# Patient Record
Sex: Male | Born: 1985 | Race: White | Hispanic: No | Marital: Single | Smoking: Never smoker
Health system: Southern US, Community
[De-identification: ages and names within clinical notes are randomized; demographics above are authoritative.]

## PROBLEM LIST (undated history)

## (undated) DIAGNOSIS — R569 Unspecified convulsions: Secondary | ICD-10-CM

## (undated) HISTORY — DX: Unspecified convulsions: R56.9

## (undated) HISTORY — PX: FINGER SURGERY: SHX640

## (undated) HISTORY — PX: VENTRICULOPERITONEAL SHUNT: SHX204

---
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
## 2012-09-05 ENCOUNTER — Other Ambulatory Visit: Payer: Self-pay

## 2012-09-05 MED ORDER — TEGRETOL 200 MG PO TABS: ORAL_TABLET | ORAL | Status: DC

## 2012-12-10 ENCOUNTER — Encounter: Payer: Self-pay | Admitting: Family

## 2012-12-10 ENCOUNTER — Ambulatory Visit (INDEPENDENT_AMBULATORY_CARE_PROVIDER_SITE_OTHER): Payer: Medicaid Other | Admitting: Family

## 2012-12-10 VITALS — BP 120/74 | HR 80 | Ht 68.5 in | Wt 142.8 lb

## 2012-12-10 DIAGNOSIS — Z79899 Other long term (current) drug therapy: Secondary | ICD-10-CM

## 2012-12-10 DIAGNOSIS — R269 Unspecified abnormalities of gait and mobility: Secondary | ICD-10-CM

## 2012-12-10 DIAGNOSIS — G808 Other cerebral palsy: Secondary | ICD-10-CM

## 2012-12-10 DIAGNOSIS — Q043 Other reduction deformities of brain: Secondary | ICD-10-CM

## 2012-12-10 DIAGNOSIS — G40209 Localization-related (focal) (partial) symptomatic epilepsy and epileptic syndromes with complex partial seizures, not intractable, without status epilepticus: Secondary | ICD-10-CM

## 2012-12-10 NOTE — Progress Notes (Signed)
Patient: Dennis Bautista MRN: [id number] Sex: male DOB: 01/12/1986  Provider: Elveria Rising, NP Location of Care: [facility]  Note type: Routine return visit  History of Present Illness: Referral Source: Dr. John Giovanni History from: Mother Chief Complaint: Yearly F/U Epilepsy  Dennis Bautista is a 27 y.o. 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 He has a well controlled seizure disorder. His last imaging was in April 2003.  He has been seizure-free since March 1985.  Dennis Bautista has been healthy since he was last seen in September, 2013. 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 He rides a motorcycle with his father and enjoys traveling to various places. He is preparing to go the beach for a month with friends of the family. Dennis Bautista is also going on a cruise in December.    Review of Systems: 12 system review was remarkable for birthmark.  Past Medical History  Diagnosis Date  . 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  He also has history of  seizures.  Surgical History Past Surgical History  Procedure Laterality Date  . Ventriculoperitoneal shunt  1986    has also had revisions of the shunt    Family History family history includes Breast cancer in his paternal grandmother; Diabetes in his other; Heart disease in his other; Stomach cancer in his maternal grandmother. Family History is negative migraines, seizures, cognitive impairment, blindness, deafness, birth defects, chromosomal disorder, autism.  Social History History   Social History  . Marital Status: Single    Spouse Name: N/A    Number of Children: N/A  . Years of Education: N/A   Social History Main Topics  . Smoking status: Never Smoker   . Smokeless tobacco: Never Used  . Alcohol Use: No  . Drug Use: No  . Sexual Activity: Not on file   Other Topics Concern  . Not on file   Social History Narrative  . 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  Living with both parents  Hobbies/Interest: Riding motorcycles with his father and going to drag races.  Current Outpatient Prescriptions on File Prior to Visit  Medication Sig Dispense Refill  . TEGRETOL 200 MG tablet Take 1 tab by mouth every morning, 1 tab at 3 pm and 1.5 tabs at 8 pm  110 tablet  3   No current facility-administered medications on file prior to visit.   The medication list was reviewed and reconciled. All changes or newly prescribed medications were explained.  A complete medication list was provided to the patient/caregiver.  No Known  Allergies  Physical Exam BP 120/74  Pulse 80  Ht 5' 8.5" (1.74 m)  Wt 142 lb 12.8 oz (64.774 kg)  BMI 21.39 kg/m2 General: well developed, well nourished young man, seated on exam table,  in no evident distress Head: head  normocephalic and atraumatic.   Ears, Nose and Throat:  Oropharynx benign. Neck: supple with no carotid or supraclavicular bruits. Respiratory: lungs clear to  auscultation Cardiovascular: regular rate and rhythm, no murmurs Musculoskeletal: He has a deformity of the right 5th finger, which is flexed into his palm.  Skin: no rashes  Neurologic Exam  Mental Status: Awake and fully alert.  Oriented to place and time.  Attention span and concentration appropriate.  Mood and affect appropriate. He needed help from his mother to answer some questions.  Cranial Nerves: Fundoscopic exam reveals sharp disc margins.  Pupils equal, briskly reactive to light.  Extraocular movements full without nystagmus.  Visual fields full to confrontation.  Hearing intact and symmetric to finger rub.  Facial sensation intact.  Facial movements are normal on the right. He has mild facial weakness on the left. Tongue and palate move normally and symmetrically.  Neck flexion and extension normal. Motor: Normal bulk and tone except for mild left hemiatrophy.  Normal strength in all tested extremity muscles except for 4/5 strength on the left upper and lower extremity. He has difficulty with thumb and finger opposition on the left.  Sensory: Intact to touch and temperature in all extremities. Coordination: Rapid alternating movements slightly slowed on the left.  Romberg negative. Gait and Station: Arises from chair without difficulty.  Stance is slightly broad based. Gait demonstrates left hemiparetic gait but fairly good balance.  Able to rise to his toes but has difficulty walking without ataxia.  Reflexes: 1+ and symmetric on the right and diminished on the right.  Toes downgoing.   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  He has schizencephaly of the right parietal region, a Chiari I malformation that seems to be  receding over time, and a functioning ventriculoperitoneal shunt. He has a well controlled seizure disorder. Dennis Bautista is taking and tolerating Tegretol for his seizures. We will make no changes in his treatment plan at this time. He will return for follow up in 1 year or sooner if needed.

## 2012-12-12 ENCOUNTER — Encounter: Payer: Self-pay | Admitting: Family

## 2012-12-12 DIAGNOSIS — Q043 Other reduction deformities of brain: Secondary | ICD-10-CM | POA: Insufficient documentation

## 2012-12-12 DIAGNOSIS — R269 Unspecified abnormalities of gait and mobility: Secondary | ICD-10-CM | POA: Insufficient documentation

## 2012-12-12 DIAGNOSIS — G40209 Localization-related (focal) (partial) symptomatic epilepsy and epileptic syndromes with complex partial seizures, not intractable, without status epilepticus: Secondary | ICD-10-CM | POA: Insufficient documentation

## 2012-12-12 DIAGNOSIS — G808 Other cerebral palsy: Secondary | ICD-10-CM | POA: Insufficient documentation

## 2012-12-12 DIAGNOSIS — Z79899 Other long term (current) drug therapy: Secondary | ICD-10-CM | POA: Insufficient documentation

## 2012-12-12 DIAGNOSIS — Z0000 Encounter for general adult medical examination without abnormal findings: Secondary | ICD-10-CM | POA: Insufficient documentation

## 2012-12-12 NOTE — Patient Instructions (Signed)
Continue Tegretol 200mg  without change. Call me if [name] has any seizures.  Plan to return for follow up in 1 year or sooner if needed.

## 2013-01-12 ENCOUNTER — Other Ambulatory Visit: Payer: Self-pay | Admitting: Family

## 2013-07-18 ENCOUNTER — Emergency Department (HOSPITAL_COMMUNITY)
Admission: EM | Admit: 2013-07-18 | Discharge: 2013-07-18 | Disposition: A | Discharge status: Home / Self Care | Payer: Medicaid Other | Attending: Emergency Medicine | Admitting: Emergency Medicine

## 2013-07-18 ENCOUNTER — Encounter (HOSPITAL_COMMUNITY): Payer: Self-pay | Admitting: Emergency Medicine

## 2013-07-18 ENCOUNTER — Emergency Department (HOSPITAL_COMMUNITY): Payer: Medicaid Other

## 2013-07-18 DIAGNOSIS — S0990XA Unspecified injury of head, initial encounter: Secondary | ICD-10-CM | POA: Insufficient documentation

## 2013-07-18 DIAGNOSIS — Z982 Presence of cerebrospinal fluid drainage device: Secondary | ICD-10-CM | POA: Insufficient documentation

## 2013-07-18 DIAGNOSIS — Y9389 Activity, other specified: Secondary | ICD-10-CM | POA: Insufficient documentation

## 2013-07-18 DIAGNOSIS — Z79899 Other long term (current) drug therapy: Secondary | ICD-10-CM | POA: Insufficient documentation

## 2013-07-18 DIAGNOSIS — Y92410 Unspecified street and highway as the place of occurrence of the external cause: Secondary | ICD-10-CM | POA: Insufficient documentation

## 2013-07-18 DIAGNOSIS — S199XXA Unspecified injury of neck, initial encounter: Principal | ICD-10-CM

## 2013-07-18 DIAGNOSIS — G40909 Epilepsy, unspecified, not intractable, without status epilepticus: Secondary | ICD-10-CM | POA: Insufficient documentation

## 2013-07-18 DIAGNOSIS — S0993XA Unspecified injury of face, initial encounter: Secondary | ICD-10-CM | POA: Insufficient documentation

## 2013-07-18 DIAGNOSIS — M542 Cervicalgia: Secondary | ICD-10-CM

## 2013-07-18 MED ORDER — CYCLOBENZAPRINE HCL 10 MG PO TABS: 10.0000 mg | ORAL_TABLET | Freq: Two times a day (BID) | ORAL | Status: DC | PRN
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
## 2013-07-18 NOTE — Care Management Note (Signed)
Patient was noted to not have a PCP listed, but per patient PCP is Dr Sudie BaileyKnowlton. Entered this information into computer.

## 2013-07-18 NOTE — Discharge Instructions (Signed)
X-ray of cervical spine was normal. Shunt was visualized and  appears normal. Medication for muscle spasm. Tylenol or ibuprofen for pain
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
## 2013-07-22 ENCOUNTER — Other Ambulatory Visit: Payer: Self-pay | Admitting: Family

## 2013-10-14 ENCOUNTER — Ambulatory Visit (INDEPENDENT_AMBULATORY_CARE_PROVIDER_SITE_OTHER): Payer: Medicaid Other | Admitting: Family

## 2013-10-14 ENCOUNTER — Encounter: Payer: Self-pay | Admitting: Family

## 2013-10-14 VITALS — BP 116/72 | HR 84 | Ht 67.0 in | Wt 146.0 lb

## 2013-10-14 DIAGNOSIS — Q043 Other reduction deformities of brain: Secondary | ICD-10-CM

## 2013-10-14 DIAGNOSIS — G808 Other cerebral palsy: Secondary | ICD-10-CM

## 2013-10-14 DIAGNOSIS — R269 Unspecified abnormalities of gait and mobility: Secondary | ICD-10-CM

## 2013-10-14 DIAGNOSIS — Z79899 Other long term (current) drug therapy: Secondary | ICD-10-CM

## 2013-10-14 DIAGNOSIS — G40209 Localization-related (focal) (partial) symptomatic epilepsy and epileptic syndromes with complex partial seizures, not intractable, without status epilepticus: Secondary | ICD-10-CM
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
## 2013-10-15 ENCOUNTER — Encounter: Payer: Self-pay | Admitting: Family
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
## 2013-12-10 ENCOUNTER — Other Ambulatory Visit: Payer: Self-pay | Admitting: Family

## 2013-12-10 MED ORDER — TEGRETOL 200 MG PO TABS: ORAL_TABLET | ORAL | Status: DC

## 2014-08-12 ENCOUNTER — Telehealth: Payer: Self-pay

## 2014-08-12 MED ORDER — TEGRETOL 200 MG PO TABS: ORAL_TABLET | ORAL | Status: DC

## 2014-08-12 NOTE — Telephone Encounter (Signed)
Rx faxed. TG 

## 2014-08-12 NOTE — Telephone Encounter (Signed)
Mardelle MatteAndy from, [facility], lvm asking for refill to be sent to them with "Brand Name Medically Necessary" written on it.

## 2014-09-02 DIAGNOSIS — Z0279 Encounter for issue of other medical certificate: Secondary | ICD-10-CM

## 2014-10-15 ENCOUNTER — Encounter: Payer: Self-pay | Admitting: Family

## 2014-10-15 ENCOUNTER — Ambulatory Visit (INDEPENDENT_AMBULATORY_CARE_PROVIDER_SITE_OTHER): Payer: Medicaid Other | Admitting: Family

## 2014-10-15 VITALS — BP 118/74 | HR 80 | Ht 68.0 in | Wt 161.4 lb

## 2014-10-15 DIAGNOSIS — Q043 Other reduction deformities of brain: Secondary | ICD-10-CM

## 2014-10-15 DIAGNOSIS — G40209 Localization-related (focal) (partial) symptomatic epilepsy and epileptic syndromes with complex partial seizures, not intractable, without status epilepticus: Secondary | ICD-10-CM | POA: Diagnosis not present

## 2014-10-15 DIAGNOSIS — R269 Unspecified abnormalities of gait and mobility: Secondary | ICD-10-CM | POA: Diagnosis not present

## 2014-10-15 DIAGNOSIS — G808 Other cerebral palsy: Secondary | ICD-10-CM | POA: Diagnosis not present

## 2014-10-15 MED ORDER — TEGRETOL 200 MG PO TABS: ORAL_TABLET | ORAL | Status: DC

## 2014-10-15 NOTE — Patient Instructions (Signed)
Dennis Bautista's medication as he has been taking it. Let me know if he has any seizures.    [name] is doing well with maintaining his weight. He should Dennis his yoga and exercise at the [facility].   Please plan to return for follow up in 1 year or sooner if needed.
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
## 2015-06-03 ENCOUNTER — Other Ambulatory Visit: Payer: Self-pay | Admitting: Family

## 2015-11-30 ENCOUNTER — Other Ambulatory Visit: Payer: Self-pay | Admitting: Family

## 2016-01-13 ENCOUNTER — Encounter (INDEPENDENT_AMBULATORY_CARE_PROVIDER_SITE_OTHER): Payer: Self-pay | Admitting: Family

## 2016-01-14 ENCOUNTER — Ambulatory Visit (INDEPENDENT_AMBULATORY_CARE_PROVIDER_SITE_OTHER): Payer: Medicaid Other | Admitting: Family

## 2016-01-14 ENCOUNTER — Encounter (INDEPENDENT_AMBULATORY_CARE_PROVIDER_SITE_OTHER): Payer: Self-pay | Admitting: Family

## 2016-01-14 VITALS — BP 120/70 | HR 84 | Ht 67.5 in | Wt 182.0 lb

## 2016-01-14 DIAGNOSIS — Q043 Other reduction deformities of brain: Secondary | ICD-10-CM

## 2016-01-14 DIAGNOSIS — R269 Unspecified abnormalities of gait and mobility: Secondary | ICD-10-CM

## 2016-01-14 DIAGNOSIS — G808 Other cerebral palsy: Secondary | ICD-10-CM | POA: Diagnosis not present

## 2016-01-14 DIAGNOSIS — G40209 Localization-related (focal) (partial) symptomatic epilepsy and epileptic syndromes with complex partial seizures, not intractable, without status epilepticus: Secondary | ICD-10-CM | POA: Diagnosis not present
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
## 2016-01-14 NOTE — Patient Instructions (Signed)
Continue giving Dennis Bautista's medication as you have been giving it. Let me know if he has any seizures or if you have any concerns.   Please plan to return for follow up in 1 year or sooner if needed.

## 2016-01-27 ENCOUNTER — Other Ambulatory Visit (INDEPENDENT_AMBULATORY_CARE_PROVIDER_SITE_OTHER): Payer: Self-pay | Admitting: Family

## 2016-08-18 ENCOUNTER — Other Ambulatory Visit: Payer: Self-pay | Admitting: Pediatrics

## 2016-09-08 ENCOUNTER — Other Ambulatory Visit: Payer: Self-pay | Admitting: Pediatrics

## 2016-09-08 MED ORDER — TEGRETOL 200 MG PO TABS: ORAL_TABLET | ORAL | 0 refills | Status: DC

## 2016-09-08 NOTE — Addendum Note (Signed)
Addended by: Harland DingwallMITCHELL, Kaeden Mester A on: 09/08/2016 11:26 AM   Modules accepted: Orders

## 2016-09-15 ENCOUNTER — Telehealth (INDEPENDENT_AMBULATORY_CARE_PROVIDER_SITE_OTHER): Payer: Self-pay | Admitting: Family

## 2016-09-15 MED ORDER — TEGRETOL 200 MG PO TABS: ORAL_TABLET | ORAL | 0 refills | Status: DC

## 2016-09-15 NOTE — Telephone Encounter (Signed)
Patient needs to sign DPR

## 2016-09-15 NOTE — Telephone Encounter (Signed)
Medication has been filled and faxed to the pharmacy

## 2016-09-15 NOTE — Telephone Encounter (Signed)
°  Who's calling (name and relationship to patient) : Karen KitchensBobbie (mom) Best contact number: [phone] Provider they see: [facility] Reason for call: Mom call for refill of Rx. Scheduled appt in September.    PRESCRIPTION REFILL ONLY  Name of prescription: Tegretol 200mg   Pharmacy:

## 2016-11-15 ENCOUNTER — Ambulatory Visit (INDEPENDENT_AMBULATORY_CARE_PROVIDER_SITE_OTHER): Payer: Medicaid Other | Admitting: Family

## 2016-11-15 ENCOUNTER — Encounter (INDEPENDENT_AMBULATORY_CARE_PROVIDER_SITE_OTHER): Payer: Self-pay | Admitting: Family

## 2016-11-15 VITALS — BP 116/70 | HR 80 | Ht 67.5 in | Wt 183.0 lb

## 2016-11-15 DIAGNOSIS — G808 Other cerebral palsy: Secondary | ICD-10-CM

## 2016-11-15 DIAGNOSIS — R269 Unspecified abnormalities of gait and mobility: Secondary | ICD-10-CM

## 2016-11-15 DIAGNOSIS — G40209 Localization-related (focal) (partial) symptomatic epilepsy and epileptic syndromes with complex partial seizures, not intractable, without status epilepticus: Secondary | ICD-10-CM | POA: Diagnosis not present

## 2016-11-15 DIAGNOSIS — F79 Unspecified intellectual disabilities: Secondary | ICD-10-CM

## 2016-11-15 DIAGNOSIS — Q043 Other reduction deformities of brain: Secondary | ICD-10-CM

## 2016-11-15 MED ORDER — TEGRETOL 200 MG PO TABS: ORAL_TABLET | ORAL | 5 refills | Status: DC

## 2016-11-15 NOTE — Progress Notes (Signed)
Patient: Dennis Bautista MRN: [id number] Sex: male DOB: 12-22-1985  Provider: Elveria Rising, NP Location of Care: [facility]  Note type: Routine return visit  History of Present Illness: Referral Source: Dennis Look, MD History from: mother, patient and [facility] chart Chief Complaint: Epilepsy  Dennis Bautista is a 31 y.o. 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 He has schizencephaly of the right parietal region, a Chiari 1 malformation, that seems to be receding over time, and a functioning ventriculoperitoneal shunt. His last imaging was in 2003. He has mild intellectual disability, complex partial seizures with secondary generalization in good control, and left congential hemiplegiaTyler was last seen January 14, 2016.   Dennis Bautista is taking and tolerating brand Tegretol for his seizure disorder and has remained seizure free since March 1985. Dennis Bautista exercises every day at the [facility] doing yoga and other group exercises. 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 There are no problems with behavior. He has been generally healthy since his last visit and neither Dennis Bautista nor his mother have other health concerns for him today other than previously mentioned.   Review of Systems: Please see the HPI for neurologic and other pertinent review of systems. Otherwise, all other systems were reviewed and were negative.    Past Medical History:  Diagnosis Date  . Seizures (HCC)    Hospitalizations: No., Head Injury: No., Nervous System Infections: No., Immunizations up to date: Yes.   Past Medical History Comments: See history   Surgical History Past Surgical History:  Procedure Laterality Date  . FINGER SURGERY      . VENTRICULOPERITONEAL SHUNT  1986   has also had revisions of the shunt    Family History family history includes Breast cancer in his paternal grandmother; Diabetes in his other; Emphysema in his maternal grandfather; Heart attack in his paternal grandfather; Heart disease in his other; Lung cancer in his paternal grandmother; Stomach cancer in his maternal grandmother. Family History is otherwise negative for migraines, seizures, cognitive impairment, blindness, deafness, birth defects, chromosomal disorder, autism.  Social History Social History   Social History  . Marital status: Single    Spouse name: N/A  . Number of children: N/A  . Years of education: N/A   Social History Main Topics  . Smoking status: Never Smoker  . Smokeless tobacco: Never Used  . Alcohol use No  . Drug use: No  . Sexual activity: No   Other Topics Concern  . 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    Lives with his parents.     Allergies Allergies  Allergen Reactions  . Other     Seasonal Allergies    Physical Exam BP 116/70   Pulse 80   Ht 5' 7.5" (1.715 m)   Wt 183 lb (83 kg)   BMI 28.24 kg/m  General: well developed, well nourished young man, seated on exam table, in no evident distress, brown hair, brown eyes, right handed Head: head normocephalic and atraumatic.  Oropharynx benign. Neck: supple with no carotid or supraclavicular bruits Cardiovascular: regular rate and rhythm, no murmurs Respiratory: breath sounds clear  to ausculation Musculoskeletal: he has a deformity to the right 5th finger, which is flexed into his palm. Skin: No rashes or neurocutaneous lesions  Neurologic Exam Mental Status: Awake and fully alert.  Oriented to place and time.  Recent and remote memory intact.  Attention span appropriate.  Mood and affect appropriate. He was unable to answer questions on his own  and needed help from his mother. He was cooperative and able to follow simple instructions. He needed extra time and some demonstrations to perform the examination.  Cranial Nerves: Fundoscopic exam reveals sharp disc margins.  Pupils equal, briskly reactive to light.  Extraocular movements full without nystagmus.  Visual fields full to confrontation.  Hearing intact and symmetric to finger rub.  Facial sensation intact. Facial movements are normal on the right. He has mild facial weakness on the left with mild drooling. Neck flexion and extension normal. Motor: Normal bulk, tone and strength on the right. He has mild left hemiatrophy. He has 4/5 left upper and lower extremity weakness. He has difficulty with left thumb and finger opposition on the left.  Sensory: Intact to touch and temperature in all extremities.  Coordination: Rapid alternating movements normal on the right, slowed on the left.  Romberg negative. Gait and Station: Arises from chair without difficulty.  Stance is normal. Gait is slightly broad based and is slightly hemiparetic on the left. He has fairly good balance. He is able to get up on his toes but has difficulty walking without some ataxia.  Reflexes: 1+ and symmetric on the right, diminished on the left. Toes downgoing.  Impression 1. Congenital left hemiplegia 2. Congenital brain malformations 3. 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 Gait abnormality 6. Intellectual disability   Recommendations for plan of care The patient's previous [facility] records were reviewed. Dennis Bautista has neither had nor required imaging or lab studies since the last visit. He is a 31 year old man with complex partial seizures with secondary generalization in good control, congenital brain malformations, congenital left hemiplegia, VP shunt, and mild intellectual disability. He is taking and tolerating brand Tegretol for his seizure disorder and will remain  on his medication for the foreseeable future because of the risk of seizure occurrence. Dennis Bautista is active at home and in the community. I commended him for exercising every day and encouraged him to continue to do so. I will see him back in follow up in 1 year or sooner if needed. Dennis Bautista and his mother agreed with the plans made today.   The medication list was reviewed and reconciled.  No changes were made in the prescribed medications today.  A complete medication list was provided to the patient and his mother.  Allergies as of 11/15/2016      Reactions   Other    Seasonal Allergies      Medication List       Accurate as of 11/15/16 11:59 PM. Always use your most recent med list.          fluticasone 50 MCG/ACT nasal spray Commonly known as:  FLONASE Place 2 sprays into both nostrils daily.   TEGRETOL 200 MG tablet Generic drug:  carbamazepine TAKE 1 TABLET IN THE MORNING AND 1 TABLET AT 3PM AND 1 AND 1/2 TABLETS AT8PM            Discharge Care Instructions        Start     Ordered   11/15/16 0000  TEGRETOL 200 MG tablet  Comments:  Brand Name Medically Necessary  Question:  Supervising Provider  Answer:  Dennis Bautista   11/15/16 1515      Total time spent with the patient was 20 minutes, of which 50% or more was spent in counseling and coordination of care.   Dennis Rising NP-C

## 2016-11-16 DIAGNOSIS — F79 Unspecified intellectual disabilities: Secondary | ICD-10-CM | POA: Insufficient documentation

## 2016-11-16 NOTE — Patient Instructions (Signed)
Continue taking the Tegretol as you have been taking it.  Let me know if you have any seizures.  Continue exercising every day as you have been doing.  Please return for follow up in 1 year or sooner if needed.

## 2017-05-29 ENCOUNTER — Other Ambulatory Visit (INDEPENDENT_AMBULATORY_CARE_PROVIDER_SITE_OTHER): Payer: Self-pay | Admitting: Family

## 2017-05-29 DIAGNOSIS — G40209 Localization-related (focal) (partial) symptomatic epilepsy and epileptic syndromes with complex partial seizures, not intractable, without status epilepticus: Secondary | ICD-10-CM

## 2018-01-19 ENCOUNTER — Other Ambulatory Visit (INDEPENDENT_AMBULATORY_CARE_PROVIDER_SITE_OTHER): Payer: Self-pay | Admitting: Family

## 2018-01-19 DIAGNOSIS — G40209 Localization-related (focal) (partial) symptomatic epilepsy and epileptic syndromes with complex partial seizures, not intractable, without status epilepticus: Secondary | ICD-10-CM

## 2018-01-19 MED ORDER — TEGRETOL 200 MG PO TABS: ORAL_TABLET | ORAL | 5 refills | Status: DC

## 2018-01-19 NOTE — Telephone Encounter (Signed)
I printed and signed an Rx for Brand Medically Necessary Tegretol. Please fax to the pharmacy. Thanks, Inetta Fermo

## 2018-01-19 NOTE — Telephone Encounter (Signed)
°  Who's calling (name and relationship to patient) : Karen Kitchens, mother (DPR on file)  Best contact number: [phone]  Provider they see: Elveria Rising  Reason for call: Patient is scheduled for a follow up on 01/25/2018.     PRESCRIPTION REFILL ONLY  Name of prescription: Tegretol  Pharmacy: [facility]

## 2018-01-19 NOTE — Telephone Encounter (Signed)
Rx has been faxed to the pharmacy 

## 2018-01-25 ENCOUNTER — Encounter (INDEPENDENT_AMBULATORY_CARE_PROVIDER_SITE_OTHER): Payer: Self-pay | Admitting: Family

## 2018-01-25 ENCOUNTER — Ambulatory Visit (INDEPENDENT_AMBULATORY_CARE_PROVIDER_SITE_OTHER): Payer: Medicaid Other | Admitting: Family

## 2018-01-25 VITALS — BP 120/90 | HR 60 | Ht 67.5 in | Wt 186.4 lb

## 2018-01-25 DIAGNOSIS — G808 Other cerebral palsy: Secondary | ICD-10-CM

## 2018-01-25 DIAGNOSIS — F79 Unspecified intellectual disabilities: Secondary | ICD-10-CM

## 2018-01-25 DIAGNOSIS — G40209 Localization-related (focal) (partial) symptomatic epilepsy and epileptic syndromes with complex partial seizures, not intractable, without status epilepticus: Secondary | ICD-10-CM | POA: Diagnosis not present

## 2018-01-25 DIAGNOSIS — R269 Unspecified abnormalities of gait and mobility: Secondary | ICD-10-CM

## 2018-01-25 DIAGNOSIS — Q043 Other reduction deformities of brain: Secondary | ICD-10-CM

## 2018-01-25 MED ORDER — TEGRETOL 200 MG PO TABS: ORAL_TABLET | ORAL | 5 refills | Status: DC

## 2018-01-25 NOTE — Patient Instructions (Signed)
Thank you for coming in today.   Instructions for you until your next appointment are as follows: 1. Continue taking Tegretol as you have been doing 2. Try not to miss any doses.  3. Please sign up for MyChart if you have not done so 4. Please plan to return for follow up in one year or sooner if needed.

## 2018-01-25 NOTE — Progress Notes (Signed)
Patient: Dennis Bautista MRN: [id number] Sex: male DOB: [birth year]  Provider: Elveria Rising, NP Location of Care: [facility]  Note type: Routine return visit  History of Present Illness: Referral Source: Katharine Look, MD History from: mother, patient and [facility] chart Chief Complaint: Epilepsy  Dennis Bautista is a 32 y.o. 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 His last imaging was in 2003. 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 Dennis Bautista was last seen November 15, 2016.  He is taking and tolerating brand Tegretol for his seizure disorder and has remained seizure free since March 1985.  Dennis Bautista and his mother report today that he has been doing well since his last visit. 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   Dennis Bautista has been otherwise generally healthy since he was last seen. Neither he nor his mother have other health concerns for him today other than previously mentioned.  Review of Systems: Please see the HPI for neurologic and other pertinent review of systems. Otherwise, all other systems were reviewed and were negative.    Past Medical History:  Diagnosis Date  . Seizures (HCC)    Hospitalizations: No., Head Injury: No., Nervous System Infections: No., Immunizations up to date: Yes.   Past Medical History Comments: See HPI  Surgical History Past Surgical History:  Procedure  Laterality Date  . FINGER SURGERY    . VENTRICULOPERITONEAL SHUNT  1986   has also had revisions of the shunt    Family History family history includes Breast cancer in his paternal grandmother; Diabetes in his other; Emphysema in his maternal grandfather; Heart attack in his paternal grandfather; Heart disease in his other; Lung cancer in his paternal grandmother; Stomach cancer in his maternal grandmother. Family History is otherwise negative for migraines, seizures, cognitive impairment, blindness, deafness, birth defects, chromosomal disorder, autism.  Social History Social History   Socioeconomic History  . Marital status: Single    Spouse name: Not on file  . Number of children: Not on file  . Years of education: Not on file  . Highest education level: Not on file  Occupational History  . Not on file  Social Needs  . Financial resource strain: Not on file  . Food insecurity:    Worry: Not on file    Inability: Not on file  . Transportation needs:    Medical: Not on file    Non-medical: Not on file  Tobacco Use  . Smoking status: Never Smoker  . Smokeless tobacco: Never Used  Substance and Sexual Activity  . Alcohol use: No  . Drug use: No  . Sexual activity: Never  Lifestyle  . Physical activity:    Days per week: Not on file    Minutes per session: Not on file  . Stress: Not on file  Relationships  . Social connections:    Talks on phone: Not on file    Gets together: Not on file  Attends religious service: Not on file    Active member of club or organization: Not on file    Attends meetings of clubs or organizations: Not on file    Relationship status: Not on file  Other Topics Concern  . 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    Lives with his parents.     Allergies Allergies  Allergen Reactions  . Other     Seasonal Allergies    Physical  Exam BP 120/90   Pulse 60   Ht 5' 7.5" (1.715 m)   Wt 186 lb 6.4 oz (84.6 kg)   BMI 28.76 kg/m  General: well developed, well nourished man, seated on the exam table, in no evident distress; brown hair, brown eyes, right handed Head: normocephalic and atraumatic. Oropharynx benign. No dysmorphic features. Neck: supple with no carotid bruits. Cardiovascular: regular rate and rhythm, no murmurs. Respiratory: Clear to auscultation bilaterally Abdomen: Bowel sounds present all four quadrants, abdomen soft, non-tender, non-distended. No hepatosplenomegaly or masses palpated.  Musculoskeletal: He has a deformity on the right 5th finger, which is flexed into his palm. Skin: no rashes or neurocutaneous lesions  Neurologic Exam Mental Status: Awake and fully alert. Oriented to time and place. Recent and remote memory intact. Attention span and concentration appropriate. Fund of knowledge subnormal for age. He was unable to answer questions on his own and needed assistance from his mother to answer questions and relay information. He was cooperative but needed some coaching to understand how to perform portions of the exam.   Cranial Nerves: Fundoscopic exam - red reflex present.  Unable to fully visualize fundus.  Pupils equal briskly reactive to light. Extraocular movements and visual fields full. Hearing intact to whisper. Facial movements are symmetric.  Neck flexion and extension normal. Motor: Normal bulk tone and strength on the right, with mild left hemiatrophy. He has 4/5 left upper and lower extremity weakness. He has difficulty with left thumb and finger opposition.   Sensory: Intact to touch and temperature.  Coordination: Rapid alternating movements normal on the right, slowed on the left. Romberg negative. Gait and Station: Arises from the chair without difficulty. Stance is normal. Gait is slightly broad based and is slightly hemiparetic on the left. He has fairly good balance. He can get  up on his toes but has difficulty with walking on this toes, heels or tandem walk.  Reflexes: Diminished on the left, 1+ on the right. Toes neutral. No clonus  Impression 1.  Congenital left hemiplegia 2.  Congenital brain malformations 3.  VP shunt 4.  Complex partial seizures with secondary generalization 5.  Gait abnormality 6.  Intellectual disability  Recommendations for plan of care The patient's previous [facility] records were reviewed. Dennis Bautista has neither had nor required imaging or lab studies since the last visit. 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 He will continue taking brand Tegretol for his seizure disorder. I asked Mom to let me know if Dennis Glassmanyler has any seizures or if she has any other concerns. I will see him back in follow up in 1 year or sooner if needed. Dennis Glassmanyler and his mother agreed with the plans made today.   The medication list was reviewed and reconciled.  No changes were made in the prescribed medications today.  A complete medication list was provided to the patient.  Allergies as of 01/25/2018      Reactions   Other    Seasonal Allergies      Medication List        Accurate as of 01/25/18 10:18 AM. Always use your most recent med list.          fluticasone 50 MCG/ACT nasal spray Commonly known as:  FLONASE Place 2 sprays into both nostrils daily.   TEGRETOL 200 MG tablet Generic drug:  carbamazepine TAKE 1 TABLET BY MOUTH IN THE MORNING, 1 TABLET AT 3 PM AND 1 AND 1/2 TABLETS BY MOUTH AT 8 PM       Total time spent with the patient was 15 minutes, of which 50% or more was spent in counseling and coordination of care.   Elveria Rising NP-C

## 2019-01-28 ENCOUNTER — Ambulatory Visit (INDEPENDENT_AMBULATORY_CARE_PROVIDER_SITE_OTHER): Payer: Medicaid Other | Admitting: Family

## 2019-02-01 ENCOUNTER — Other Ambulatory Visit (INDEPENDENT_AMBULATORY_CARE_PROVIDER_SITE_OTHER): Payer: Self-pay | Admitting: Family

## 2019-02-01 DIAGNOSIS — G40209 Localization-related (focal) (partial) symptomatic epilepsy and epileptic syndromes with complex partial seizures, not intractable, without status epilepticus: Secondary | ICD-10-CM

## 2019-02-04 ENCOUNTER — Telehealth (INDEPENDENT_AMBULATORY_CARE_PROVIDER_SITE_OTHER): Payer: Self-pay | Admitting: Family

## 2019-02-04 NOTE — Telephone Encounter (Signed)
This Rx has been faxed. TG

## 2019-02-04 NOTE — Telephone Encounter (Signed)
Please refax this Tegretol RX to [phone], This was not received on 11/8.

## 2019-02-04 NOTE — Telephone Encounter (Signed)
This Rx has been faxed. TG 

## 2019-02-04 NOTE — Telephone Encounter (Signed)
°  Who's calling (name and relationship to patient) : Ogden Best contact number: [phone] Provider they see: [place]  Reason for call:     [place]  Name of prescription: Tegretol 200mg  [facility]  (fax) [phone]

## 2019-02-04 NOTE — Telephone Encounter (Signed)
°  Who's calling (name and relationship to patient) : Moist,Bobbie Best contact number: [phone] Provider they see: Cloretta Ned  Reason for call:     [place]  Name of prescription: Tegretol 200mg  [facility]

## 2019-02-05 ENCOUNTER — Other Ambulatory Visit: Payer: Self-pay

## 2019-02-05 DIAGNOSIS — Z20822 Contact with and (suspected) exposure to covid-19: Secondary | ICD-10-CM

## 2019-02-06 LAB — NOVEL CORONAVIRUS, NAA: SARS-CoV-2, NAA: NOT DETECTED

## 2019-02-11 ENCOUNTER — Encounter (INDEPENDENT_AMBULATORY_CARE_PROVIDER_SITE_OTHER): Payer: Self-pay | Admitting: Family

## 2019-02-11 ENCOUNTER — Ambulatory Visit (INDEPENDENT_AMBULATORY_CARE_PROVIDER_SITE_OTHER): Payer: Medicaid Other | Admitting: Family

## 2019-02-11 ENCOUNTER — Other Ambulatory Visit: Payer: Self-pay

## 2019-02-11 VITALS — BP 130/90 | HR 72 | Ht 67.5 in | Wt 189.0 lb

## 2019-02-11 DIAGNOSIS — G40209 Localization-related (focal) (partial) symptomatic epilepsy and epileptic syndromes with complex partial seizures, not intractable, without status epilepticus: Secondary | ICD-10-CM

## 2019-02-11 DIAGNOSIS — Q043 Other reduction deformities of brain: Secondary | ICD-10-CM

## 2019-02-11 DIAGNOSIS — R269 Unspecified abnormalities of gait and mobility: Secondary | ICD-10-CM

## 2019-02-11 DIAGNOSIS — G808 Other cerebral palsy: Secondary | ICD-10-CM | POA: Diagnosis not present

## 2019-02-11 DIAGNOSIS — F79 Unspecified intellectual disabilities: Secondary | ICD-10-CM

## 2019-02-11 MED ORDER — TEGRETOL 200 MG PO TABS: ORAL_TABLET | ORAL | 5 refills | Status: DC

## 2019-02-11 NOTE — Progress Notes (Signed)
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 His last imaging was in 2003. 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 He is taking and tolerating Tegretol for his seizure disorder and has remained seizure free since March 1985.    Today's concerns:  Dad reports that Dennis Bautista has remained seizure free since his last visit. He has gained some weight from being at home more due to Covid 19 pandemic. Dad says that they are finding some things to do but that boredom is a problem as Dennis Bautista is used to being active.   Dennis Bautista has been generally healthy since he was last seen. Neither he nor Dad have other health concerns for him today other than previously mentioned.   Review of systems: Please see HPI for neurologic and other pertinent review of systems. Otherwise all other systems were reviewed and were negative.  Problem List: Patient Active Problem List   Diagnosis Date Noted  . Intellectual disability, mild 11/16/2016  . Partial epilepsy with impairment of consciousness (HCC) 12/12/2012  . Congenital reduction deformities of brain (HCC) 12/12/2012  . Congenital hemiplegia (HCC) 12/12/2012  . Abnormality of gait 12/12/2012  . Encounter for long-term (current) use of other  medications 12/12/2012     Past Medical History:  Diagnosis Date  . Seizures (HCC)     Past medical history comments: See HPI   Surgical history: Past Surgical History:  Procedure Laterality Date  . FINGER SURGERY    . VENTRICULOPERITONEAL SHUNT  1986   has also had revisions of the shunt     Family history: family history includes Breast cancer in his paternal grandmother; Diabetes in an other family member; Emphysema in his maternal grandfather; Heart attack in his paternal grandfather; Heart disease in an other family member; Lung cancer in his paternal grandmother; Stomach cancer in his maternal grandmother.   Social history: Social History   Socioeconomic History  . Marital status: Single    Spouse name: Not on file  . Number of children: Not on file  . Years of education: Not on file  . Highest education level: Not on file  Occupational History  . Not on file  Social Needs  . Financial resource strain: Not on file  . Food insecurity    Worry: Not on file    Inability: Not on file  . Transportation needs    Medical: Not on file    Non-medical: Not on file  Tobacco Use  . Smoking status: Never Smoker  . Smokeless tobacco: Never Used  Substance and Sexual Activity  . Alcohol use: No  . Drug use: No  . Sexual activity: Never  Lifestyle  . Physical activity    Days per week: Not on file    Minutes per session: Not on file  . Stress: Not on file  Relationships  . Social Musician on phone: Not on file    Gets together: Not on file    Attends religious service: Not on file    Active member of club or organization: Not on file    Attends meetings of clubs or organizations: Not on file    Relationship status: Not on file  . Intimate partner violence    Fear of current or ex partner: Not on file    Emotionally abused: Not on file    Physically abused: Not on file    Forced sexual activity: Not on file  Other Topics Concern  . 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    Lives with his parents.       Past/failed meds:   Allergies: Allergies  Allergen Reactions  . Other     Seasonal Allergies      Immunizations:  There is no immunization history on file for this patient.    Diagnostics/Screenings:    Physical Exam: BP 130/90   Pulse 72   Ht 5' 7.5" (1.715 m)   Wt 189 lb (85.7 kg)   BMI 29.16 kg/m   General: Well developed, well nourished man, seated in exam room, in no evident distress, brown hair, brown eyes, right handed Head: Head normocephalic and atraumatic.  Oropharynx benign. Neck: Supple with no carotid bruits Cardiovascular: Regular rate and rhythm, no murmurs Respiratory: Breath sounds clear to auscultation Musculoskeletal: He has a deformity on the right 5th finger, which is flexed into his palm. Skin: No rashes or neurocutaneous lesions  Neurologic Exam Mental Status: Awake and fully alert.  Oriented to place and time. Attention span, concentration normal for age. Fund of knowledge subnormal for age. He was unable to answer questions on his own and needed help from his father to answer questions and relay information.  Mood and affect appropriate. He was cooperative but needed coaching at times to perform portions of the examination. Cranial Nerves: Fundoscopic exam reveals red reflex present. Unable to fully visualize fundus. Pupils equal, briskly reactive to light.  Extraocular movements full without nystagmus. Hearing intact and symmetric to whisper.  Facial sensation intact.  Face tongue, palate move normally and symmetrically.  Shoulder shrug normal.  Motor: Normal bulk, tone and strength on the right, with mild left hemiatrophy. He has 4/5 left upper and lower body weakness. He has difficulty with left finger and thumb opposition.  Sensory: Intact to touch and temperature in all extremities.   Coordination: Rapid alternating movements normal on the right, slowed on the left.  Finger-to-nose and heel-to shin performed accurately bilaterally.  Romberg negative. Gait and Station: Arises from chair without difficulty.  Stance is normal. Gait is slightly broad based and is slightly hemiparetic on the left with fairly good balance.  He can get up on his toes but has difficulty with heel, toe and tandem walk. Reflexes: Diminished on the left, 1+ on the right. Toes neutral. No clonus.  Impression: 1. Congenital left hemiplegia 2. Congenital brain malformations 3. VP shunt 4. Complex partial seizures with secondary generalization 5. Gait abnormality 6. Intellectual disability  Recommendations for plan of care: The patient's previous [facility] records were reviewed. Dennis Bautista has neither had nor required imaging or lab studies since the last visit.  He is a 34 year old man with history of complex partial seizures  with secondary generalization, congenital brain malformations and congenital left hemiplegia, VP shunt, gait abnormality and intellectual disability. He is taking and tolerating brand Tegretol and has remained seizure free on this medication. He will continue taking Tegretol for his lifetime. I asked Dad to let me know if Dorothea Ogle has any seizures or other concerns. I will see him back in follow up in 1 year or sooner if needed. Dorothea Ogle and his father agreed with the plans made today.   The medication list was reviewed and reconciled. No changes were made in the prescribed medications today. A complete medication list was provided to the patient.  Allergies as of 02/11/2019      Reactions   Other    Seasonal Allergies      Medication List       Accurate as of February 11, 2019  9:08 AM. If you have any questions, ask your nurse or doctor.        fluticasone 50 MCG/ACT nasal spray Commonly known as: FLONASE Place 2 sprays into both nostrils as needed.   TEGretol 200 MG tablet Generic  drug: carbamazepine TAKE 1 TABLET BY MOUTH IN THE MORNING, 1TABLET AT 3 PM, AND 1 AND 1/2 TABLETS BYMOUTH AT 8 PM.        Total time spent with the patient was 20 minutes, of which 50% or more was spent in counseling and coordination of care.  Rockwell Germany NP-C [facility] Ph. [phone] Fax [phone]

## 2019-02-13 ENCOUNTER — Telehealth: Payer: Self-pay | Admitting: *Deleted

## 2019-02-13 NOTE — Telephone Encounter (Signed)
Patient's father is calling for COVID test result- negative. Patient tested for possible exposure. Patient advised to continue safe practices, contact PCP for changes and get flu shot if not already current.

## 2019-02-14 ENCOUNTER — Encounter (INDEPENDENT_AMBULATORY_CARE_PROVIDER_SITE_OTHER): Payer: Self-pay | Admitting: Family

## 2019-02-14 NOTE — Patient Instructions (Signed)
Thank you for coming in today.   Instructions for you until your next appointment are as follows: 1. Continue taking Tegretol as prescribed 2. Let me know if you have any seizures 3. Please sign up for MyChart if you have not done so 4. Please plan to return for follow up in one year or sooner if needed.

## 2019-07-24 ENCOUNTER — Other Ambulatory Visit (INDEPENDENT_AMBULATORY_CARE_PROVIDER_SITE_OTHER): Payer: Self-pay | Admitting: Family

## 2019-07-24 DIAGNOSIS — G40209 Localization-related (focal) (partial) symptomatic epilepsy and epileptic syndromes with complex partial seizures, not intractable, without status epilepticus: Secondary | ICD-10-CM

## 2019-12-03 ENCOUNTER — Ambulatory Visit (INDEPENDENT_AMBULATORY_CARE_PROVIDER_SITE_OTHER): Payer: Medicare Other | Admitting: Dermatology

## 2019-12-03 ENCOUNTER — Encounter: Payer: Self-pay | Admitting: Dermatology

## 2019-12-03 ENCOUNTER — Other Ambulatory Visit: Payer: Self-pay

## 2019-12-03 DIAGNOSIS — L309 Dermatitis, unspecified: Secondary | ICD-10-CM | POA: Diagnosis not present

## 2019-12-03 DIAGNOSIS — Z1283 Encounter for screening for malignant neoplasm of skin: Secondary | ICD-10-CM

## 2019-12-03 NOTE — Patient Instructions (Addendum)
First visit in years for Dennis Bautista date of birth [birth year].  In June he developed an acute inflammation on the left side of the face involving the forehead which spread down cheek to the chin.  He saw a doctor when they were in [place] who put Dennis Bautista on valacyclovir plus cephalexin.  The second doctor subsequently prescribed a second round of antiviral and today he received a recommendation that since the left eye was still reddened that he could take a third round.  He was kind enough to provide excellent photographs and the blistered to pustular one-sided facial eruption certainly fits herpes zoster also called shingles.  Because Dennis Bautista is only 34 years old he probably will do well with or without therapy. I completely agree with the valacyclovir and understand the cephalexin the first time; I do not disagree with the second round of valacyclovir but I do question whether the eye redness is not just a little reactive hyperemia rather than any residual virus.  I did encourage him to be seen by an eye doctor.  I have asked the family to kindly give me a phone call and leave me a message in 3 to 4 weeks to let me know everything has cleared. the uncertainty when Dennis Bautista gets to be older is whether he will actually need to get the Shingrix vaccine. hopefully by then we will have more information.

## 2020-01-02 NOTE — Progress Notes (Signed)
   New Patient   Subjective  Dennis Bautista is a 34 y.o. male who presents for the following: Annual Exam (facial lesions x months mid forehead and left side of face).  Annual skin exam. Location: face Duration:  Quality:  Associated Signs/Symptoms: Modifying Factors:  Severity:  Timing: Context: Blisterd pustules on one side of face.    The following portions of the chart were reviewed this encounter and updated as appropriate:     Objective  Well appearing patient in no apparent distress; mood and affect are within normal limits.  Waist up skin examination plus regional lymph nodes oral mucosa.   Assessment & Plan    First visit in years for Dennis Bautista date of birth 05-18-1985.  In June he developed an acute inflammation on the left side of the face involving the forehead which spread down cheek to the chin.  He saw a doctor when they were in [place] who put Rakim on valacyclovir plus cephalexin.  The second doctor subsequently prescribed a second round of antiviral and today he received a recommendation that since the left eye was still reddened that he could take a third round.  He was kind enough to provide excellent photographs and the blistered to pustular one-sided facial eruption certainly fits herpes zoster also called shingles.  Because Dennis Bautista is only 34 years old he probably will do well with or without therapy. I completely agree with the valacyclovir and understand the cephalexin the first time; I do not disagree with the second round of valacyclovir but I do question whether the eye redness is not just a little reactive hyperemia rather than any residual virus.  I did encourage him to be seen by an eye doctor.  I have asked the family to kindly give me a phone call and leave me a message in 3 to 4 weeks to let me know everything has cleared. the uncertainty when Dennis Bautista gets to be older is whether he will actually need to get the Shingrix vaccine. hopefully by then we will  have more information.   Encounter for screening for malignant neoplasm of skin Neck - Anterior  Yearly skin check  Dermatitis Left [place]  Previous treatment pcp gave oral valcyclovir- no new oral med today. Patient should continue to improve with no new treatment.  Will follow up in 3-4 weeks

## 2020-01-05 ENCOUNTER — Encounter: Payer: Self-pay | Admitting: Dermatology

## 2020-01-16 ENCOUNTER — Ambulatory Visit: Payer: Medicare Other | Attending: Internal Medicine

## 2020-01-16 ENCOUNTER — Ambulatory Visit: Payer: Self-pay

## 2020-01-16 DIAGNOSIS — Z23 Encounter for immunization: Secondary | ICD-10-CM

## 2020-01-16 NOTE — Progress Notes (Signed)
   Covid-19 Vaccination Clinic  Name:  Dennis Bautista    MRN: [id number] DOB: 02-27-1986  01/16/2020  Mr. Dennis Bautista was observed post Covid-19 immunization for 15 minutes without incident. He was provided with Vaccine Information Sheet and instruction to access the V-Safe system.   Mr. Dennis Bautista was instructed to call 911 with any severe reactions post vaccine: Marland Kitchen Difficulty breathing  . Swelling of face and throat  . A fast heartbeat  . A bad rash all over body  . Dizziness and weakness

## 2020-02-24 ENCOUNTER — Other Ambulatory Visit (INDEPENDENT_AMBULATORY_CARE_PROVIDER_SITE_OTHER): Payer: Self-pay | Admitting: Family

## 2020-02-24 DIAGNOSIS — G40209 Localization-related (focal) (partial) symptomatic epilepsy and epileptic syndromes with complex partial seizures, not intractable, without status epilepticus: Secondary | ICD-10-CM

## 2020-03-25 ENCOUNTER — Other Ambulatory Visit (INDEPENDENT_AMBULATORY_CARE_PROVIDER_SITE_OTHER): Payer: Self-pay | Admitting: Family

## 2020-03-25 DIAGNOSIS — G40209 Localization-related (focal) (partial) symptomatic epilepsy and epileptic syndromes with complex partial seizures, not intractable, without status epilepticus: Secondary | ICD-10-CM

## 2020-03-25 NOTE — Telephone Encounter (Signed)
Please send if applicable. Patient has not been seen in over a year

## 2020-03-26 ENCOUNTER — Encounter: Payer: Medicare Other | Attending: Family Medicine | Admitting: Nutrition

## 2020-03-26 ENCOUNTER — Other Ambulatory Visit: Payer: Self-pay

## 2020-03-26 ENCOUNTER — Encounter: Payer: Self-pay | Admitting: Nutrition

## 2020-03-26 VITALS — Ht 70.0 in | Wt 174.0 lb

## 2020-03-26 DIAGNOSIS — F79 Unspecified intellectual disabilities: Secondary | ICD-10-CM | POA: Insufficient documentation

## 2020-03-26 DIAGNOSIS — R635 Abnormal weight gain: Secondary | ICD-10-CM | POA: Insufficient documentation

## 2020-03-26 NOTE — Progress Notes (Signed)
Medical Nutrition Therapy  Appointment Start time: 1300   Appointment End time:  1400 This visit was completed via telephone due to the COVID-19 pandemic.   I spoke with Dennis Bautista and family members and verified that I was speaking with the correct person with two patient identifiers (full name and date of birth).   I discussed the limitations related to this kind of visit and the patient is willing to proceed. Primary concerns today: Wants to lose belly fat and be more active. Strong family support.  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 Learning readiness: change in progress  Presennt on call: Dennis Bautista,  Karen Kitchens , mother, Audelia Acton, Caregiver: Precious Bard,   NUTRITION ASSESSMENT  Has high cholesterol, belly fat. His parents and care provider want to work together to provide healthier meals.  He wants to lose a little weight but tone up more and make better food choices. He wants to get rid of his belly fat.   Would like some meal plans Walks- every day with his dad.  Beginning weight loss was 191 lbs December 1st 2021.  Has cut down out on to only 1 Diet Coke daily. Has cut out  regular sodas. His family prepares his meals for him.  Anthropometrics   Wt Readings from Last 3 Encounters:  03/26/20 174 lb (78.9 kg)  02/11/19 189 lb (85.7 kg)  01/25/18 186 lb 6.4 oz (84.6 kg)   Ht Readings from Last 3 Encounters:  03/26/20 5\' 10"  (1.778 m)  02/11/19 5' 7.5" (1.715 m)  01/25/18 5' 7.5" (1.715 m)   Body mass index is 24.97 kg/m. @BMIFA @ Facility age limit for growth percentiles is 20 years. Facility age limit for growth percentiles is 20 years.  Clinical Medical Hx: Special Needs.  Medications: see chart Labs: Unable to access Notable Signs/Symptoms: belly fat  Lifestyle & Dietary Hx Walks with his dad and does errands with him.   Estimated daily fluid intake: 64 oz Supplements: none Sleep: 8 Stress /  self-care: NA Current average weekly physical activity: walks 30 minutes a few days per week   24-Hr Dietary Recall First Meal: Sausage and eggs whites,  Or canadian bacon,  Snack:  Second Meal: Chicken noodle soup,  Snack:  Third Meal: Chicken, broccoli, salad, water Snack:  Beverages: water or diet coke once a day.   Estimated Energy Needs Calories: 18000 Carbohydrate: 200 g Protein: 135g Fat: 50 g   NUTRITION DIAGNOSIS  Undesireable food choices related to lack of nutrition knowledge as evidence by drinking 5+ diet sodas per day and belly fat.  NUTRITION INTERVENTION  Nutrition education (E-1) on the following topics:  . My Plate . Healthy Snacks . Drinking water  Handouts Provided Include   My Plate  Weight loss tips   Learning Style & Readiness for Change Teaching method utilized: Visual & Auditory  Demonstrated degree of understanding via: Teach Back  Barriers to learning/adherence to lifestyle change: special needs-has strong family support to help him.    Goal set by patient  Increase exercise 1 mile daily 4 days. Eat 3 balanced meals per day. Drink 6 bottles of water per day Eat meals on time Work on tone up some with exercise. Do 10 situps daily.   MONITORING & EVALUATION Dietary intake, weekly physical activity, in 2-3 months .

## 2020-03-26 NOTE — Patient Instructions (Addendum)
Goal set by patient  Increase exercise 1 mile daily 4 days. Eat 3 balanced meals per day. Drink 6 bottles of water per day Eat meals on time Work on tone up some with exercise. Do 10 situps daily.

## 2020-03-30 ENCOUNTER — Encounter: Payer: Self-pay | Admitting: Nutrition

## 2020-04-06 ENCOUNTER — Encounter (INDEPENDENT_AMBULATORY_CARE_PROVIDER_SITE_OTHER): Payer: Self-pay | Admitting: Family

## 2020-04-06 ENCOUNTER — Ambulatory Visit (INDEPENDENT_AMBULATORY_CARE_PROVIDER_SITE_OTHER): Payer: Medicare Other | Admitting: Family

## 2020-04-06 ENCOUNTER — Other Ambulatory Visit: Payer: Self-pay

## 2020-04-06 VITALS — BP 110/80 | HR 76 | Ht 67.5 in | Wt 176.8 lb

## 2020-04-06 DIAGNOSIS — Z79899 Other long term (current) drug therapy: Secondary | ICD-10-CM | POA: Insufficient documentation

## 2020-04-06 DIAGNOSIS — G808 Other cerebral palsy: Secondary | ICD-10-CM

## 2020-04-06 DIAGNOSIS — G40209 Localization-related (focal) (partial) symptomatic epilepsy and epileptic syndromes with complex partial seizures, not intractable, without status epilepticus: Secondary | ICD-10-CM

## 2020-04-06 DIAGNOSIS — G253 Myoclonus: Secondary | ICD-10-CM | POA: Insufficient documentation

## 2020-04-06 DIAGNOSIS — F79 Unspecified intellectual disabilities: Secondary | ICD-10-CM

## 2020-04-06 DIAGNOSIS — R269 Unspecified abnormalities of gait and mobility: Secondary | ICD-10-CM

## 2020-04-06 DIAGNOSIS — Q043 Other reduction deformities of brain: Secondary | ICD-10-CM

## 2020-04-06 MED ORDER — TEGRETOL 200 MG PO TABS: ORAL_TABLET | ORAL | 5 refills | Status: DC

## 2020-04-06 MED ORDER — CLONAZEPAM 0.5 MG PO TABS: ORAL_TABLET | ORAL | 0 refills | Status: DC

## 2020-04-06 NOTE — Progress Notes (Signed)
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 His last imaging was in 2003. 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 He is taking and tolerating Tegretol for his seizure disorder and has remained seizure free since March 1985.   Today's concerns:  Dennis Bautista and his father report today that he has remained seizure free since his last visit but that he has been having episodes of his right arm and shoulder "jumping" for about a year. Dad describes it as a shivering movement and a jerk. Dennis Bautista says that it hurts sometimes when it happens but that it is mostly annoying to him. Dad says that it can happen once per day or several times in a row.   Dennis Bautista denies any missed doses of medication and says that he gets at least 8 hours of sleep each night. Dad notes that Dennis Bautista has always been a light sleeper and that sometimes he takes Melatonin to help him to sleep better.   Dennis Bautista has been otherwise generally healthy since he was last seen. Neither he or his father have other health concerns for him today other than previously mentioned.  Review of systems: Please see HPI for neurologic and other pertinent review of systems. Otherwise all other systems were  reviewed and were negative.  Problem List: Patient Active Problem List   Diagnosis Date Noted  . Intellectual disability, mild 11/16/2016  . Partial epilepsy with impairment of consciousness (HCC) 12/12/2012  . Congenital reduction deformities of brain (HCC) 12/12/2012  . Congenital hemiplegia (HCC) 12/12/2012  . Abnormality of gait 12/12/2012  . Encounter for long-term (current) use of other medications 12/12/2012     Past Medical History:  Diagnosis Date  . Seizures (HCC)     Past medical history comments: See HPI  Surgical history: Past Surgical History:  Procedure Laterality Date  . FINGER SURGERY    . VENTRICULOPERITONEAL SHUNT  1986   has also had revisions of the shunt     Family history: family history includes Breast cancer in his paternal grandmother; Diabetes in an other family member; Emphysema in his maternal grandfather; Heart attack in his paternal grandfather; Heart disease in an other family member; Lung cancer in his paternal grandmother; Stomach cancer in his maternal grandmother.   Social history: Social History   Socioeconomic History  . Marital status: Single    Spouse name: Not on file  . Number of children: Not on file  . Years of education: Not on file  . Highest education level: Not on file  Occupational History  . Not on file  Tobacco Use  . Smoking status: Never Smoker  . Smokeless tobacco: Never Used  Vaping Use  . Vaping Use: Never used  Substance and Sexual Activity  . Alcohol use: No  . Drug use: No  .  Sexual activity: Never  Other Topics Concern  . Not on file  Social History Narrative   Madsen works with his father 5 days a week. 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    Lives with his parents.    Social Determinants of Health   Financial Resource Strain: Not on file  Food Insecurity: Not on file  Transportation Needs: Not on file  Physical Activity: Not on file  Stress: Not on file  Social Connections:  Not on file  Intimate Partner Violence: Not on file    Past/failed meds:   Allergies: Allergies  Allergen Reactions  . Other     Seasonal Allergies      Immunizations: Immunization History  Administered Date(s) Administered  . Janssen (J&J) SARS-COV-2 Vaccination 36/06/2019    Diagnostics/Screenings:  Physical Exam: BP 110/80   Pulse 76   Ht 5' 7.5" (1.715 m)   Wt 176 lb 12.8 oz (80.2 kg)   BMI 27.28 kg/m   General: well developed, well nourished young man, seated in exam room, in no evident distress; brown hair, brown eyes, right handed Head: normocephalic and atraumatic. Oropharynx benign. No dysmorphic features. Neck: supple Cardiovascular: regular rate and rhythm, no murmurs. Respiratory: clear to auscultation bilaterally Abdomen: bowel sounds present all four quadrants, abdomen soft, non-tender, non-distended.  Musculoskeletal: he has deformity of the right 5th finger, which is flexed into his palm Skin: no rashes or neurocutaneous lesions  Neurologic Exam Mental Status: awake and fully alert. Attention span and concentration normal for age. Fund of knowledge subnormal for age. Normal speech and language but needs help from his father to answer questions and relay information. Mood and affect appropriate.  Cranial Nerves: fundoscopic exam - red reflex present.  Unable to fully visualize fundus.  Pupils equal briskly reactive to light.  Turns to localize faces and objects in the periphery. Turns to localize sounds in the periphery. Facial movements are symmetric. Tongue is midline. Motor: normal bulk, tone and strength on the right. Mild left hemiatrophy with left hemiplegia. He has difficulty with left finger and thumb opposition. Sensory: withdrawal x 4 Coordination: normal on the right, clumsy on the left Gait and Station: able to stand and bear weight. Gait is slightly hemiparetic on the left  Impression: 1. Myoclonic jerks 2. Complex partial seizures with  secondary generalizaiton 3. Congenital left hemiplegia 4. Congenital brain malformations 5. VP shunt 6. Gait abnormality 7. Intellectual disability  Recommendations for plan of care: The patient's previous [facility] records were reviewed. Dennis Bautista has neither had nor required imaging or lab studies since the last visit. He is a 35 year old man with history of congenital left hemiplegia, congential brain malformations, VP shunt, intellectual disability, and complex partial seizures with secondary generalization. He has new finding of myoclonic jerks. Dennis Bautista had 2 jerks while in the exam room, and they involved his right more than his left shoulder and upper arm. He had no change in awareness with the jerks. I talked with Dennis Bautista and his father about the myoclonus and told them that sometimes they happen when a person is tired, but that sometimes they are idiopathic. I recommended checking Tegretol level and gave them an order to get that done. I will call them when I have received the results. I also gave him Clonazepam and asked Dad to let me know both how Dennis Bautista tolerates it and if it stops the jerking spells. If the myoclonus continues, I will schedule Dennis Bautista for an EEG  as it has been many years since an EEG has been performed. I will otherwise see Dennis Bautista back in follow up in 1 year or sooner if needed. Dennis Bautista and his father agreed with the plans made today.   The medication list was reviewed and reconciled. I reviewed the changes that were made in the prescribed medications today. A complete medication list was provided to the patient.  Orders Placed This Encounter  Procedures  . Carbamazepine Level (Tegretol), total    Draw in the morning before the first dose of medication of the day    Standing Status:   Future    Number of Occurrences:   1    Standing Expiration Date:   06/04/2020     Allergies as of 04/06/2020      Reactions   Other    Seasonal Allergies      Medication List       Accurate as  of April 06, 2020  4:25 PM. If you have any questions, ask your nurse or doctor.        STOP taking these medications   cefUROXime 500 MG tablet Commonly known as: CEFTIN Stopped by: Elveria Rising, NP   sulfamethoxazole-trimethoprim 800-160 MG tablet Commonly known as: BACTRIM DS Stopped by: Elveria Rising, NP   valACYclovir 500 MG tablet Commonly known as: VALTREX Stopped by: Elveria Rising, NP     TAKE these medications   clonazePAM 0.5 MG tablet Commonly known as: KLONOPIN Take 1/2 to 1 tablet for episodes of myoclonic jerks. Do not take more than 2 times per day Started by: Elveria Rising, NP   fluticasone 50 MCG/ACT nasal spray Commonly known as: FLONASE Place 2 sprays into both nostrils as needed.   TEGretol 200 MG tablet Generic drug: carbamazepine TAKE 1 TABLET BY MOUTH IN THE MORNING, 1TABLET AT 3 PM, AND 1 AND 1/2 TABLETS BYMOUTH AT 8 PM.        Total time spent with the patient was 25 minutes, of which 50% or more was spent in counseling and coordination of care.  Elveria Rising NP-C [facility] Ph. [phone] Fax [phone]

## 2020-04-06 NOTE — Patient Instructions (Signed)
Thank you for coming in today. You are experiencing myoclonic jerks. These are involuntary muscle jerks. Sometimes they occur when a person is tired. These types of jerks can sometimes trigger seizures, particularly if they are very "hard" or if you have several jerks in a row.   We will check a Tegretol level to see if your medication needs adjustment. I have given you a blood test order to get this done. The blood should be drawn in the morning, before the first dose of medication of the day. Be sure to take your medicine after the blood has been drawn.   I have given you a prescription for the jerks called Clonazepam. Take 1 tablet if the jerks are very hard or if you have more than 3 in a row.   If the jerks continue despite taking the Clonazepam, let me know and we will schedule you for an EEG to see if your seizures have changed in some way.   In the meantime, be sure to take your medication every day, and be sure to get at least 8 hours of sleep each night.   Please plan to return for follow up in one year or sooner if needed.  Myoclonus Myoclonus is the sudden and quick movement of the muscles. It may include muscle jerks, twitches, or spasms that happen without a person's control (involuntary). There are different types of myoclonus. One type, called physiologic myoclonus, occurs normally in most people and rarely needs treatment. This type includes:  Hiccups.  Sleep starts, or twitching during sleep. Other types are caused by an underlying condition and may require treatment. What are the causes? The cause of this condition varies depending on the type of myoclonus. Physiologic myoclonus, such as hiccups, results from normal actions of the body. Other causes include:  Genetic condition or disease. This causes a type of myoclonus called essential myoclonus.  Epilepsy with seizures. This causes epileptic myoclonus.  Other underlying conditions cause secondary or symptomatic  myoclonus. These include: ? Side effects of certain medicines. ? Metabolic conditions. ? Head, brain, or spinal injuries. ? Stroke. ? Nervous system conditions such as dementia, Parkinson disease, and Alzheimer disease. ? Infections. ? Poisoning. ? Brain tumors. ? Not having enough oxygen. What are the signs or symptoms? The main symptom of this condition is sudden spasms, jerking, or uncontrollable movements. Symptoms:  May occur in only one area of the body or over the entire body.  May come and go and may vary in intensity.  May make it hard for you to eat, speak, or walk. How is this diagnosed? This condition is diagnosed based on your medical history, a review of your symptoms, and a physical exam. You may also have tests, including:  Electroencephalogram (EEG). This test checks the electrical activity of the brain.  Electromyogram (EMG). This test measures electrical activity in the muscles.  Imaging tests such as an MRI or CT scan.  Lumbar puncture (spinal tap) to check spinal fluid for infection or inflammation.  Blood tests.  Urine tests.   How is this treated? Treatment for this condition depends on the underlying condition and the severity of your symptoms. Treatment may include:  Medicines, such as tranquilizer and anti-seizure medicines.  Injections of botulinum toxins.  Treating the underlying cause, such as having a tumor removed or taking antibiotic medicine for an infection. Physiologic myoclonus does not require treatment. Follow these instructions at home:  Take over-the-counter and prescription medicines only as told by your health  care provider.  If you are taking tranquilizer or anti-seizure medicines, do not drive or use heavy machinery until your body adjusts to the medicine. These medicines may cause drowsiness.  Keep a journal of your symptoms and the things that seem to trigger them or make them worse. Also, try to identity the things that  make them better. Share this information with your health care provider.  Keep all follow-up visits as told by your health care provider. This is important. Contact a health care provider if:  You have severe pain and medicines do not help.  You have problems taking your medicines.  Your twitches, jerks, or spasms get worse. Get help right away if:  You have a seizure.  You have a reaction to your medicines, such as a rash, trouble breathing, or swelling.  You have trouble staying alert. Summary  Myoclonus is the sudden and quick movement of the muscles. It may include muscle jerks, twitches, or spasms that happen without a person's control (involuntary).  There are different types of myoclonus. Physiologic myoclonus occurs normally in most people and rarely needs treatment. Other types of myoclonus are caused by an underlying condition.  Treatment for this condition depends on the underlying condition and the severity of your symptoms.  If you are taking tranquilizer or anti-seizure medicines, do not drive or use heavy machinery until your body adjusts to the medicine. These medicines may cause drowsiness. This information is not intended to replace advice given to you by your health care provider. Make sure you discuss any questions you have with your health care provider. Document Revised: 11/22/2019 Document Reviewed: 11/22/2019 Elsevier Patient Education  2021 [employer].

## 2020-04-09 ENCOUNTER — Other Ambulatory Visit (INDEPENDENT_AMBULATORY_CARE_PROVIDER_SITE_OTHER): Payer: Self-pay | Admitting: Family

## 2020-04-09 DIAGNOSIS — G40209 Localization-related (focal) (partial) symptomatic epilepsy and epileptic syndromes with complex partial seizures, not intractable, without status epilepticus: Secondary | ICD-10-CM

## 2020-04-09 LAB — CARBAMAZEPINE LEVEL, TOTAL: Carbamazepine Lvl: 5.6 mg/L (ref 4.0–12.0)

## 2020-04-09 MED ORDER — TEGRETOL 200 MG PO TABS: ORAL_TABLET | ORAL | 5 refills | Status: DC

## 2020-05-04 ENCOUNTER — Telehealth (INDEPENDENT_AMBULATORY_CARE_PROVIDER_SITE_OTHER): Payer: Self-pay | Admitting: Family

## 2020-05-04 DIAGNOSIS — G40209 Localization-related (focal) (partial) symptomatic epilepsy and epileptic syndromes with complex partial seizures, not intractable, without status epilepticus: Secondary | ICD-10-CM

## 2020-05-04 NOTE — Telephone Encounter (Signed)
I called and talked to Dad. He said that [name] was still having shivering and jerking movements of his right arm. He also has occasional sound like a laugh at odd times when no one else is laughing. Dad says that the medication has not changed these behaviors. He occasionally has flurries of the movement and has days with none of the behaviors. Dad says that [name] gets stressed at times with changes in his schedule, such as visits with his mother and he wonders if he behaviors are related to anxiety.  I talked to Dad and told him that they could be motor tics, muscle spasms, seizures or myoclonic jerks. I recommended an EEG and Dad agreed. Dad also reported that Mom has set up a second opinion at [facility] for Ali Molina because she has a nephew-in-law that is a neurosurgeon there, and she simply wants a second opinion regarding his condition.  I told Dad that I will call him when I receive the EEG results. I asked him to video any spells that he could, and to let me know if the episodes become more frequent or more severe. Dad agreed with the plans made today. TG

## 2020-05-04 NOTE — Telephone Encounter (Signed)
Who's calling (name and relationship to patient) : Audelia Acton  Best contact number: [phone]  Provider they see: Elveria Rising  Reason for call: Dad called stating that after stating new medication the patient is still having symptoms. Please call to discuss dad states he was instructed to call Inetta Fermo if this happened  Call ID:      PRESCRIPTION REFILL ONLY  Name of prescription:  Pharmacy:

## 2020-05-25 ENCOUNTER — Ambulatory Visit (INDEPENDENT_AMBULATORY_CARE_PROVIDER_SITE_OTHER): Payer: Medicare Other | Admitting: Neurology

## 2020-05-25 ENCOUNTER — Other Ambulatory Visit: Payer: Self-pay

## 2020-05-25 DIAGNOSIS — G40209 Localization-related (focal) (partial) symptomatic epilepsy and epileptic syndromes with complex partial seizures, not intractable, without status epilepticus: Secondary | ICD-10-CM

## 2020-05-25 NOTE — Progress Notes (Signed)
OP adult EEG completed at [facility] office, results pending.

## 2020-05-26 NOTE — Procedures (Signed)
Patient:  BENI TURRELL   Sex: male  DOB:  [birth year]  Date of study:   05/25/2020               Clinical history: This is a 36 year old male with history of congenital brain abnormalities, VP shunt, intellectual disability and complex partial seizure.  This is a follow-up EEG for evaluation of epileptiform discharges and seizure activity.  Medication:   Trileptal             Procedure: The tracing was carried out on a 32 channel digital [employer]well recorder reformatted into 16 channel montages with 1 devoted to EKG.  The 10 /20 international system electrode placement was used. Recording was done during awake, drowsiness and sleep states. Recording time 31.5 minutes.   Description of findings: Background rhythm consists of amplitude of 60 microvolt and frequency of 8-9 hertz posterior dominant rhythm. There was normal anterior posterior gradient noted. Background was well organized, continuous and symmetric with no focal slowing but there is very slight generalized slowing of the background activity. There was muscle artifact noted. During drowsiness and sleep there was gradual decrease in background frequency noted. During the early stages of sleep there were weeks symmetrical sleep spindles and occasional partial vertex sharp waves noted.  Hyperventilation resulted in slowing of the background activity. Photic stimulation using stepwise increase in photic frequency resulted in bilateral symmetric driving response. Throughout the recording there were no focal or generalized epileptiform activities in the form of spikes or sharps noted. There were no transient rhythmic activities or electrographic seizures noted. One lead EKG rhythm strip revealed sinus rhythm at a rate of 70 bpm.  Impression: This EEG is normal with no epileptiform discharges or seizure activity and no background asymmetry except for very slight generalized slowing. Please note that normal EEG does not exclude epilepsy, clinical  correlation is indicated.     Keturah Shavers, MD

## 2020-05-28 ENCOUNTER — Encounter: Payer: Medicare Other | Attending: Family Medicine | Admitting: Nutrition

## 2020-05-28 ENCOUNTER — Encounter: Payer: Self-pay | Admitting: Nutrition

## 2020-05-28 VITALS — Ht 70.0 in | Wt 169.0 lb

## 2020-05-28 DIAGNOSIS — F79 Unspecified intellectual disabilities: Secondary | ICD-10-CM | POA: Insufficient documentation

## 2020-05-28 DIAGNOSIS — Z724 Inappropriate diet and eating habits: Secondary | ICD-10-CM | POA: Insufficient documentation

## 2020-05-28 DIAGNOSIS — R635 Abnormal weight gain: Secondary | ICD-10-CM | POA: Insufficient documentation

## 2020-05-28 NOTE — Progress Notes (Signed)
Medical Nutrition Therapy  Appointment Start time: 1300   Appointment End time:  1400 This visit was completed via telephone due to the COVID-19 pandemic.   I spoke with Duwaine Maxin and family members and verified that I was speaking with the correct person with two patient identifiers (full name and date of birth).   I discussed the limitations related to this kind of visit and the patient is willing to proceed. Primary concerns today: Wants to lose belly fat and be more active. Strong family support.  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 Learning readiness: change in progress  Present on call: Riaz Rajkumar-dad 7 lbs weight loss since last mychart visit. Doing very well. Eating meals on time. Cut out a lot on snacking. Getting more exercise and drinking more water. 1 diet soda per day now. Family is all doing it together. Cooking meals at home more. Making better choices when eating out.  NUTRITION ASSESSMENT   Has high cholesterol, belly fat. His parents and care provider want to work together to provide healthier meals.  He wants to lose a little weight but tone up more and make better food choices. He wants to get rid of his belly fat.   Would like some meal plans Walks- every day with his dad.  Beginning weight loss was 191 lbs December 1st 2021.  Has cut down out on to only 1 Diet Coke daily. Has cut out  regular sodas. His family prepares his meals for him. Doing very well.  Anthropometrics   Wt Readings from Last 3 Encounters:  04/06/20 176 lb 12.8 oz (80.2 kg)  03/26/20 174 lb (78.9 kg)  02/11/19 189 lb (85.7 kg)   Ht Readings from Last 3 Encounters:  04/06/20 5' 7.5" (1.715 m)  03/26/20 5\' 10"  (1.778 m)  02/11/19 5' 7.5" (1.715 m)   There is no height or weight on file to calculate BMI. @BMIFA @ Facility age limit for growth percentiles is 20 years. Facility age limit for growth percentiles is 20  years.  Clinical Medical Hx: Special Needs.  Medications: see chart Labs: Unable to access Notable Signs/Symptoms: belly fat  Lifestyle & Dietary Hx Walks with his dad and does errands with him.   Estimated daily fluid intake: 64 oz Supplements: none Sleep: 8 Stress / self-care: NA Current average weekly physical activity: walks 30 minutes a few days per week   24-Hr Dietary Recall First Meal: Oatmeal or life cereal, 02/13/19 sausage/eggs, coffee Snack:  Second Meal: Salad with Vinaigrette, water Snack:  Third Meal: salmon, roasted corn/peas,  water Snack:   Beverages: water or diet coke once a day.   Estimated Energy Needs Calories: 1800 Carbohydrate: 200 g Protein: 135g Fat: 50 g   NUTRITION DIAGNOSIS  Undesireable food choices related to lack of nutrition knowledge as evidence by drinking 5+ diet sodas per day and belly fat.  NUTRITION INTERVENTION  Nutrition education (E-1) on the following topics:  . My Plate-doing very well. Healthy Snacks-doing very well. . Drinking water- working on it.  Handouts Provided Include   My Plate  Weight loss tips   Learning Style & Readiness for Change Teaching method utilized: Visual & Auditory  Demonstrated degree of understanding via: Teach Back  Barriers to learning/adherence to lifestyle change: special needs-has strong family support to help him.    Goal set by patient  Keep up the great job!!! Call if any questions Increase exercise as tolerated  Continue to read  food labels  Monitor portions.  MONITORING & EVALUATION Dietary intake, weekly physical activity, prn

## 2020-06-08 ENCOUNTER — Encounter: Payer: Self-pay | Admitting: Nutrition

## 2020-06-08 ENCOUNTER — Other Ambulatory Visit: Payer: Self-pay

## 2020-06-08 NOTE — Patient Instructions (Signed)
Goal set by patient  Keep up the great job!!! Call if any questions Increase exercise as tolerated  Continue to read food labels  Monitor portions.

## 2020-06-25 ENCOUNTER — Telehealth (INDEPENDENT_AMBULATORY_CARE_PROVIDER_SITE_OTHER): Payer: Self-pay | Admitting: Family

## 2020-06-25 DIAGNOSIS — G40209 Localization-related (focal) (partial) symptomatic epilepsy and epileptic syndromes with complex partial seizures, not intractable, without status epilepticus: Secondary | ICD-10-CM

## 2020-06-25 MED ORDER — TEGRETOL 200 MG PO TABS: ORAL_TABLET | ORAL | 5 refills | Status: DC

## 2020-06-25 NOTE — Telephone Encounter (Signed)
  Who's calling (name and relationship to patient) : [facility]  Best contact number: [phone]  Provider they see: Elveria Rising  Reason for call: Pharmacy states they need a new RX sent over with updated instructions.    PRESCRIPTION REFILL ONLY  Name of prescription: TEGRETOL 200 MG tablet  Pharmacy: [facility] - [place], [place] - [address]

## 2020-06-25 NOTE — Telephone Encounter (Signed)
Rx faxed as requested. TG 

## 2020-07-19 ENCOUNTER — Encounter (INDEPENDENT_AMBULATORY_CARE_PROVIDER_SITE_OTHER): Payer: Self-pay

## 2021-01-25 ENCOUNTER — Other Ambulatory Visit (INDEPENDENT_AMBULATORY_CARE_PROVIDER_SITE_OTHER): Payer: Self-pay | Admitting: Family

## 2021-01-25 DIAGNOSIS — G40209 Localization-related (focal) (partial) symptomatic epilepsy and epileptic syndromes with complex partial seizures, not intractable, without status epilepticus: Secondary | ICD-10-CM

## 2021-03-29 ENCOUNTER — Other Ambulatory Visit (INDEPENDENT_AMBULATORY_CARE_PROVIDER_SITE_OTHER): Payer: Self-pay | Admitting: Family

## 2021-03-29 DIAGNOSIS — G40209 Localization-related (focal) (partial) symptomatic epilepsy and epileptic syndromes with complex partial seizures, not intractable, without status epilepticus: Secondary | ICD-10-CM

## 2021-04-07 ENCOUNTER — Encounter (INDEPENDENT_AMBULATORY_CARE_PROVIDER_SITE_OTHER): Payer: Self-pay | Admitting: Family

## 2021-04-07 ENCOUNTER — Ambulatory Visit (INDEPENDENT_AMBULATORY_CARE_PROVIDER_SITE_OTHER): Payer: Medicare Other | Admitting: Family

## 2021-04-07 ENCOUNTER — Other Ambulatory Visit: Payer: Self-pay

## 2021-04-07 VITALS — BP 116/70 | Wt 168.0 lb

## 2021-04-07 DIAGNOSIS — G808 Other cerebral palsy: Secondary | ICD-10-CM

## 2021-04-07 DIAGNOSIS — R269 Unspecified abnormalities of gait and mobility: Secondary | ICD-10-CM

## 2021-04-07 DIAGNOSIS — G40209 Localization-related (focal) (partial) symptomatic epilepsy and epileptic syndromes with complex partial seizures, not intractable, without status epilepticus: Secondary | ICD-10-CM

## 2021-04-07 DIAGNOSIS — G253 Myoclonus: Secondary | ICD-10-CM | POA: Diagnosis not present

## 2021-04-07 DIAGNOSIS — Q043 Other reduction deformities of brain: Secondary | ICD-10-CM

## 2021-04-07 DIAGNOSIS — F79 Unspecified intellectual disabilities: Secondary | ICD-10-CM

## 2021-04-07 NOTE — Progress Notes (Signed)
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 His last imaging was in 2003. 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 He is taking and tolerating Tegretol for his seizure disorder and has remained seizure free since March 1985. He has been experiencing intermittent myoclonic jerks since early 2021.  Today's concerns: Parents report today that Dennis Bautista has remained seizure free but continues to have intermittent myoclonic jerks. He had work up by Dr Dennis Bautista at [facility] and no new problems were found. His parents tell me that Dr Dennis Bautista recommended switching off Tegretol at some point to a newer antiepileptic medication with a lower adverse affect profile. They are interested in this plan but understandably nervous as Dennis Bautista has been seizure free since March 1985 on Tegretol. His parents also report that lab studies were done but I do not have copy of those reports.   Dennis Bautista has been otherwise generally healthy since he was last seen. Neither he nor his parents have other health concerns for him today other than previously mentioned.  Review of systems: Please see HPI for neurologic and other pertinent review of systems. Otherwise all other systems were reviewed and  were negative.  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/2012   Congenital hemiplegia ([place]) 12/12/2012   Abnormality of gait 12/12/2012     Past Medical History:  Diagnosis Date   Seizures ([place])     Past medical history comments: See HPI  Surgical history: Past Surgical History:  Procedure Laterality Date   [place]   has also had revisions of the shunt     Family history: family history includes Breast cancer in his paternal grandmother; Diabetes in an other family member; Emphysema in his maternal grandfather; Heart attack in his paternal grandfather; Heart disease in an other family member; Lung cancer in his paternal grandmother; Stomach cancer in his maternal grandmother.   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    Lives with his parents.    Social Determinants of Health   Financial Resource Strain: Not on file  Food Insecurity: Not on file  Transportation Needs: Not on file  Physical Activity: Not on file  Stress: Not on file  Social Connections: Not on file   Intimate Partner Violence: Not on file   Past/failed meds: Copied from previous record: Clonazepam did not improve incidence of myoclonic jerks  Allergies: Allergies  Allergen Reactions   Other     Seasonal Allergies    Immunizations: Immunization History  Administered Date(s) Administered   Janssen (J&J) SARS-COV-2 Vaccination 01/16/2020    Diagnostics/Screenings: Copied from previous record: 06/29/2020 - ambulatory EEG ([facility])- Based on the above clinical and EEG data: The patient's typical episodes were not recorded, but the background EEG suggests epiletogenecity in bitemporal region. [place], MD [place]   Physical Exam: BP 116/70    Wt 168 lb (76.2 kg)    BMI 24.11 kg/m   General: well developed, well nourished man, seated on exam table, in no evident distress Head: microcephalic and atraumatic. Oropharynx benign. No dysmorphic features. Neck: supple Cardiovascular: regular rate and rhythm, no murmurs. Respiratory: clear to auscultation bilaterally Abdomen: bowel sounds present all four quadrants, abdomen soft, non-tender, non-distended. Musculoskeletal: he has deformity of the right 5th finger, which is flexed into his palm Skin: no rashes or neurocutaneous lesions  Neurologic Exam Mental Status: awake and fully alert. Attention span and concentration normal for age Dennis Bautista of knowledge subnormal for age. Normal speech and language but needs help from his parents to answer questions and relay information. Mood and affect appropriate. Cranial Nerves: fundoscopic exam - red reflex present.  Unable to fully visualize fundus.  Pupils equal briskly reactive to light.  Turns to localize faces and objects in the periphery. Turns to localize sounds in the periphery. Facial movements are symmetric. Motor: normal bulk, tone and strength on the right. Mild left hemiatrophy with left hemiplegia. Has difficulty with finger and thumb opposition Sensory:  withdrawal x 4 Coordination: normal on the right, clumsy on the left Gait and Station: mild left hemiparetic gait  Impression: Partial epilepsy with impairment of consciousness (HCC)  Congenital reduction deformities of brain (HCC)  Congenital hemiplegia (HCC)  Myoclonus  Intellectual disability, mild  Abnormality of gait   Recommendations for plan of care: The patient's previous [facility] records were reviewed. Dennis Bautista has neither had nor required imaging or lab studies since the last visit, other than what was performed by his PCP and at [facility]. His parents are aware of those results. He is a 36 year old man with history of congential brain malformations with VP shunt, congenital left hemiplegia, intellectual disability, myoclonic jerks and epilepsy. He is taking and tolerating brand Tegretol and has remained seizure free since March 1985. We talked at some length today about switching to a new antiepileptic medication and his parents are understandably nervous about that process. I asked them to let me know if they want to change the medication in the future. I will otherwise see Dennis Bautista back in follow up in 1 year or sooner if needed.   The medication list was reviewed and reconciled. No changes were made in the prescribed medications today. A complete medication list was provided to the patient.  Return in about 1 year (around 04/07/2022).  Allergies as of 04/07/2021       Reactions   Other    Seasonal Allergies        Medication List        Accurate as of April 07, 2021 12:14 PM. If you have any questions, ask your nurse or doctor.          atorvastatin 40 MG tablet Commonly known as: LIPITOR Take 40 mg by mouth daily.   calcium carbonate 500 MG chewable tablet Commonly known as: TUMS - dosed in mg elemental calcium Chew 1 tablet by mouth daily.   clonazePAM 0.5 MG tablet Commonly known as: KLONOPIN Take 1/2 to 1 tablet for episodes of myoclonic jerks. Do  not take more than 2 times per day   fluticasone 50 MCG/ACT nasal spray Commonly known as: FLONASE Place 2 sprays into both nostrils as needed.   TEGretol 200 MG tablet Generic drug: carbamazepine TAKE ONE AND ONE-HALF TABLETS BY MOUTH IN THE MORNING, ONE TABLET AT 3 PM, AND ONE AND ONE-HALF TABLETS AT 8 PM.      Total time spent with the patient was 25 minutes, of which 50% or more was spent in counseling and coordination of care.  Dennis Germany NP-C [facility] Ph. [phone] Fax [phone]

## 2021-04-07 NOTE — Patient Instructions (Signed)
It was a pleasure to see you today!  Instructions for you until your next appointment are as follows: I will call to see if I can get results of lab studies for [name] and will call you if there are any concerns.  If you decide to taper off the Tegretol and onto a new seizure medicine, let me know.  Please sign up for MyChart if you have not done so. Please plan to return for follow up in one year or sooner if needed.    Feel free to contact our office during normal business hours at [phone] with questions or concerns. If there is no answer or the call is outside business hours, please leave a message and our clinic staff will call you back within the next business day.  If you have an urgent concern, please stay on the line for our after-hours answering service and ask for the on-call neurologist.     I also encourage you to use MyChart to communicate with me more directly. If you have not yet signed up for MyChart within [facility], the front desk staff can help you. However, please note that this inbox is NOT monitored on nights or weekends, and response can take up to 2 business days.  Urgent matters should be discussed with the on-call pediatric neurologist.   At [facility], we are committed to providing exceptional care. You will receive a patient satisfaction survey through text or email regarding your visit today. Your opinion is important to me. Comments are appreciated.

## 2021-04-09 ENCOUNTER — Encounter (INDEPENDENT_AMBULATORY_CARE_PROVIDER_SITE_OTHER): Payer: Self-pay | Admitting: Family

## 2022-04-11 ENCOUNTER — Ambulatory Visit (INDEPENDENT_AMBULATORY_CARE_PROVIDER_SITE_OTHER): Payer: 59 | Admitting: Family

## 2022-04-11 ENCOUNTER — Encounter (INDEPENDENT_AMBULATORY_CARE_PROVIDER_SITE_OTHER): Payer: Self-pay | Admitting: Family

## 2022-04-11 VITALS — BP 120/80 | HR 90 | Ht 67.52 in | Wt 168.8 lb

## 2022-04-11 DIAGNOSIS — R269 Unspecified abnormalities of gait and mobility: Secondary | ICD-10-CM

## 2022-04-11 DIAGNOSIS — G40209 Localization-related (focal) (partial) symptomatic epilepsy and epileptic syndromes with complex partial seizures, not intractable, without status epilepticus: Secondary | ICD-10-CM | POA: Diagnosis not present

## 2022-04-11 DIAGNOSIS — G808 Other cerebral palsy: Secondary | ICD-10-CM

## 2022-04-11 DIAGNOSIS — G253 Myoclonus: Secondary | ICD-10-CM

## 2022-04-11 DIAGNOSIS — Q043 Other reduction deformities of brain: Secondary | ICD-10-CM | POA: Diagnosis not present

## 2022-04-11 DIAGNOSIS — F79 Unspecified intellectual disabilities: Secondary | ICD-10-CM

## 2022-04-11 MED ORDER — TEGRETOL 200 MG PO TABS: ORAL_TABLET | ORAL | 5 refills | Status: DC

## 2022-04-11 NOTE — Progress Notes (Signed)
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 His last imaging was in 2003. 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 He is taking and tolerating Tegretol for his seizure disorder and has remained seizure free since March 1985. He has been experiencing intermittent myoclonic jerks since early 2021.   Today's concerns: Has remained seizure free Continues to exhibit intermittent myoclonic jerks Parents have questions about diet and seizure disorders Parents are not interested in switching from Tegretol to a newer antiepileptic medication as we discussed a his last visit PCP provided copy of recent lab studies. CBC, CMP, Lipids all within normal limits. Vitamin D level lower normal at 40 - is on supplementation. Carbamazepine level therapeutic at 7.27mcg/ml Dennis Bautista has been otherwise generally healthy since he was last seen. No health concerns today other than previously mentioned.  Review of systems: Please see HPI for neurologic and other pertinent review of systems. Otherwise all other systems were reviewed and were negative.  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/12/2012   Congenital hemiplegia (HCC) 12/12/2012   Abnormality of gait 12/12/2012     Past Medical History:  Diagnosis Date   Seizures (HCC)     Past medical history comments: See HPI  Surgical history: Past Surgical History:  Procedure Laterality Date   FINGER SURGERY     VENTRICULOPERITONEAL SHUNT  1986   has also had revisions of the shunt     Family history: family history includes Breast cancer in his paternal grandmother; Diabetes in an other family member; Emphysema in his maternal grandfather; Heart attack in his paternal grandfather; Heart disease in an other family member; Lung cancer in his paternal grandmother; Stomach cancer in his maternal grandmother.   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    Lives with his parents.  Social Determinants of Health   Financial Resource Strain: Not on file  Food Insecurity: Not on file  Transportation Needs: Not on file  Physical Activity: Not on file  Stress: Not on file  Social Connections: Not on file  Intimate Partner Violence: Not on file    Past/failed meds: Clonazepam did not improve incidence of myoclonic jerks    Allergies: Allergies  Allergen Reactions   Other     Seasonal Allergies    Immunizations: Immunization History  Administered Date(s) Administered   Janssen (J&J) SARS-COV-2 Vaccination 01/16/2020    Diagnostics/Screenings: Copied from previous record: 06/29/2020 - ambulatory EEG ([facility])- Based on the above clinical and EEG data: The patient's typical episodes were not recorded, but the background EEG suggests epiletogenecity in bitemporal region. [place], MD [place]    Physical Exam: BP 120/80 (BP Location: Left Arm, Patient Position: Sitting, Cuff Size: Normal)   Pulse 90   Ht 5' 7.52" (1.715 m)   Wt 168 lb 12.8 oz (76.6 kg)   BMI 26.03 kg/m   General: well developed, well nourished man, seated on exam table, in no evident distress Head: normocephalic and atraumatic. Oropharynx benign. No dysmorphic features. Neck: supple Cardiovascular: regular rate and rhythm, no murmurs. Respiratory: clear to auscultation bilaterally Abdomen: bowel sounds present all four quadrants, abdomen soft, non-tender, non-distended. Musculoskeletal: has deformity of the right 5th finger, which is flexed into his palm Skin: no rashes or neurocutaneous lesions  Neurologic Exam Mental Status: awake and fully alert. Attention span normal for age. Fund of knowledge subnormal for age. Normal speech but needed help from his parents to answer questions. Cranial Nerves: fundoscopic exam - red reflex present.  Unable to fully visualize fundus.  Pupils equal briskly reactive to light.  Turns to localize faces and objects in the periphery. Turns to localize sounds in the periphery. Facial movements are symmetric.  Motor: mild left hemiatrophy with left hemiplegia. Clumsy finger and thumb opposition. Sensory: withdrawal x 4 Coordination: unable to adequately assess due to patient's inability to participate in examination. No dysmetria when reaching for objects. Gait and  Station: mild left hemiplegic gait Reflexes: diminished and symmetric. Toes neutral. No clonus   Impression: Partial epilepsy with impairment of consciousness (HCC) - Plan: TEGRETOL 200 MG tablet  Congenital reduction deformities of brain (HCC)  Congenital hemiplegia (HCC)  Myoclonus  Intellectual disability, mild  Abnormality of gait   Recommendations for plan of care: The patient's previous Epic records were reviewed. Recent lab studies were reviewed with the patient and his parents. I reviewed basics of ketogenic diet for seizures as well as modified Atkins diet, which has been shown to help with seizure reduction. As Dorothea Ogle has remained seizure free for sometime, I recommended basic well rounded diet for him. Plan until next visit: Continue medications as prescribed  Continue to monitor myoclonic jerks and let me know if they become more frequent or more severe Call seizures occur Return in about 1 year (around 04/12/2023).  The medication list was reviewed and reconciled. No changes were made in the prescribed medications today. A complete medication list was provided to the patient.  Allergies as of 04/11/2022       Reactions   Other    Seasonal Allergies        Medication List        Accurate as of April 11, 2022  2:48 PM. If you have any questions, ask your nurse or doctor.  atorvastatin 40 MG tablet Commonly known as: LIPITOR Take 40 mg by mouth daily.   calcium carbonate 500 MG chewable tablet Commonly known as: TUMS - dosed in mg elemental calcium Chew 1 tablet by mouth daily.   fluticasone 50 MCG/ACT nasal spray Commonly known as: FLONASE Place 2 sprays into both nostrils as needed.   TEGretol 200 MG tablet Generic drug: carbamazepine TAKE ONE AND ONE-HALF TABLETS BY MOUTH IN THE MORNING, ONE TABLET AT 3 PM, AND ONE AND ONE-HALF TABLETS AT 8 PM.      Total time spent with the patient was 25 minutes, of which 50% or more was spent in  counseling and coordination of care.  Rockwell Germany NP-C [facility] [address], [place], [place] [address] Ph. [phone] Fax [phone]

## 2022-04-11 NOTE — Patient Instructions (Signed)
It was a pleasure to see you today!  Instructions for you until your next appointment are as follows: Continue your medication as prescribed Let me know if the myoclonic jerks become more frequent or more severe.  Let me know if seizures occur Please sign up for MyChart if you have not done so. Please plan to return for follow up in one year or sooner if needed.  Feel free to contact our office during normal business hours at [phone] with questions or concerns. If there is no answer or the call is outside business hours, please leave a message and our clinic staff will call you back within the next business day.  If you have an urgent concern, please stay on the line for our after-hours answering service and ask for the on-call neurologist.     I also encourage you to use MyChart to communicate with me more directly. If you have not yet signed up for MyChart within [facility], the front desk staff can help you. However, please note that this inbox is NOT monitored on nights or weekends, and response can take up to 2 business days.  Urgent matters should be discussed with the on-call pediatric neurologist.   At [facility], we are committed to providing exceptional care. You will receive a patient satisfaction survey through text or email regarding your visit today. Your opinion is important to me. Comments are appreciated.

## 2022-08-31 ENCOUNTER — Other Ambulatory Visit (INDEPENDENT_AMBULATORY_CARE_PROVIDER_SITE_OTHER): Payer: Self-pay | Admitting: Family

## 2022-08-31 DIAGNOSIS — G40209 Localization-related (focal) (partial) symptomatic epilepsy and epileptic syndromes with complex partial seizures, not intractable, without status epilepticus: Secondary | ICD-10-CM

## 2022-08-31 MED ORDER — TEGRETOL 200 MG PO TABS: ORAL_TABLET | ORAL | 5 refills | Status: DC

## 2022-08-31 NOTE — Addendum Note (Signed)
Addended by: Sherren Mocha N on: 08/31/2022 10:20 AM   Modules accepted: Orders

## 2022-08-31 NOTE — Addendum Note (Signed)
Addended by: Princella Ion on: 08/31/2022 02:04 PM   Modules accepted: Orders

## 2022-08-31 NOTE — Telephone Encounter (Signed)
Who's calling (name and relationship to patient) :Azucena Kuba- Dad   Best contact number:[phone]   Provider they see: [facility]   Reason for call:Dad called in stating that the pharmacy called in a prescription refill but it has yet to be filled    Call ID:      PRESCRIPTION REFILL ONLY  Name of prescription:Tegretol 200 mg tablet   Pharmacy:[facility] [place]- [address]

## 2022-08-31 NOTE — Telephone Encounter (Signed)
The Rx was printed and faxed to the pharmacy. TG

## 2023-01-31 ENCOUNTER — Encounter: Payer: Self-pay | Admitting: Internal Medicine

## 2023-01-31 ENCOUNTER — Ambulatory Visit (INDEPENDENT_AMBULATORY_CARE_PROVIDER_SITE_OTHER): Payer: 59 | Admitting: Internal Medicine

## 2023-01-31 VITALS — BP 110/78 | HR 76 | Resp 16 | Ht 70.0 in | Wt 163.8 lb

## 2023-01-31 DIAGNOSIS — G40209 Localization-related (focal) (partial) symptomatic epilepsy and epileptic syndromes with complex partial seizures, not intractable, without status epilepticus: Secondary | ICD-10-CM | POA: Diagnosis not present

## 2023-01-31 DIAGNOSIS — E785 Hyperlipidemia, unspecified: Secondary | ICD-10-CM | POA: Insufficient documentation

## 2023-01-31 DIAGNOSIS — E782 Mixed hyperlipidemia: Secondary | ICD-10-CM | POA: Diagnosis not present

## 2023-01-31 NOTE — Assessment & Plan Note (Signed)
Currently prescribed atorvastatin 40 mg daily.  Labs were recently updated by his previous PCP and lipid panel reflected adequate control. -Repeat labs at follow-up in 1 year

## 2023-01-31 NOTE — Patient Instructions (Signed)
It was a pleasure to see you today.  Thank you for giving [place] the opportunity to be involved in your care.  Below is a brief recap of your visit and next steps.  We will plan to see you again in 1 year.  Summary You have established care today No medication changes were made No labs ordered Follow up in 1 year for annual exam

## 2023-01-31 NOTE — Progress Notes (Signed)
New Patient Office Visit  Subjective    Patient ID: Dennis Bautista, male    DOB: 1985/04/29  Age: 37 y.o. MRN: [id number]  CC:  Chief Complaint  Patient presents with   Establish Care   HPI Dennis Bautista presents to establish care.  He is a 37 year old male with a previously documented past medical history significant for Chiari malformation s/p VP shunt, focal epilepsy, mild intellectual disability, left congenital hemiplegia and schizencephaly, and hyperlipidemia.  Previously followed by Dr. Sudie Bailey.  Dennis Bautista reports feeling well today.  He is asymptomatic and has no acute concerns to discuss.  He is accompanied by his father.  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  He denies tobacco, alcohol, and illicit drug use.  His family medical history is significant for CAD, prostate cancer, lung cancer, and diabetes mellitus.  Chronic medical conditions and outstanding preventative care items discussed today are individually addressed A/P below.  Outpatient Encounter Medications as of 01/31/2023  Medication Sig   atorvastatin (LIPITOR) 40 MG tablet Take 40 mg by mouth daily.   cetirizine (ZYRTEC) 10 MG tablet Take 10 mg by mouth daily.   fluticasone (FLONASE) 50 MCG/ACT nasal spray Place 2 sprays into both nostrils as needed.   TEGRETOL 200 MG tablet TAKE ONE AND ONE-HALF TABLETS BY MOUTH IN THE MORNING, ONE TABLET AT 3 PM, AND ONE AND ONE-HALF TABLETS AT 8 PM.   [DISCONTINUED] calcium carbonate (TUMS - DOSED IN MG ELEMENTAL CALCIUM) 500 MG chewable tablet Chew 1 tablet by mouth daily.   No facility-administered encounter medications on file as of 01/31/2023.    Past Medical History:  Diagnosis Date   Seizures (HCC)     Past Surgical History:  Procedure Laterality Date   FINGER SURGERY     VENTRICULOPERITONEAL SHUNT  1986   has also had revisions of the shunt    Family History  Problem Relation Age of Onset   Stomach cancer Maternal Grandmother         died of complications from appendicitis   Breast cancer Paternal Grandmother    Lung cancer Paternal Grandmother    Emphysema Maternal Grandfather    Heart attack Paternal Grandfather    Heart disease Other        Heart Disease in Grandparents   Diabetes Other        Diabetes in Grandparents    Social History   Socioeconomic History   Marital status: Single    Spouse name: Not on file   Number of children: Not on file   Years of education: Not on file   Highest education level: Not on file  Occupational History   Not on file  Tobacco Use   Smoking status: Never   Smokeless tobacco: Never  Vaping Use   Vaping status: Never Used  Substance and Sexual Activity   Alcohol use: No   Drug use: No   Sexual activity: Never  Other Topics Concern   Not on file  Social History Narrative   [place] works with his father 5 days a week. 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    Lives with his parents.    Social Determinants of Health   Financial Resource Strain: Not on file  Food Insecurity: Not on file  Transportation Needs: Not on file  Physical Activity: Not on file  Stress: Not on file  Social Connections: Not on file  Intimate Partner Violence: Not  on file   Review of Systems  Constitutional:  Negative for chills and fever.  HENT:  Negative for sore throat.   Respiratory:  Negative for cough and shortness of breath.   Cardiovascular:  Negative for chest pain, palpitations and leg swelling.  Gastrointestinal:  Negative for abdominal pain, blood in stool, constipation, diarrhea, nausea and vomiting.  Genitourinary:  Negative for dysuria and hematuria.  Musculoskeletal:  Negative for myalgias.  Skin:  Negative for itching and rash.  Neurological:  Negative for dizziness and headaches.  Psychiatric/Behavioral:  Negative for depression and suicidal ideas.    Objective    BP 110/78   Pulse 76   Resp 16   Ht 5\' 10"  (1.778 m)   Wt 163 lb 12.8 oz (74.3  kg)   SpO2 95%   BMI 23.50 kg/m   Physical Exam Vitals reviewed.  Constitutional:      General: He is not in acute distress.    Appearance: Normal appearance. He is not ill-appearing.  HENT:     Head: Normocephalic and atraumatic.     Right Ear: External ear normal.     Left Ear: External ear normal.     Nose: Nose normal. No congestion or rhinorrhea.     Mouth/Throat:     Mouth: Mucous membranes are moist.     Pharynx: Oropharynx is clear.  Eyes:     General: No scleral icterus.    Extraocular Movements: Extraocular movements intact.     Conjunctiva/sclera: Conjunctivae normal.     Pupils: Pupils are equal, round, and reactive to light.  Cardiovascular:     Rate and Rhythm: Normal rate and regular rhythm.     Pulses: Normal pulses.     Heart sounds: Normal heart sounds. No murmur heard. Pulmonary:     Effort: Pulmonary effort is normal.     Breath sounds: Normal breath sounds. No wheezing, rhonchi or rales.  Abdominal:     General: Abdomen is flat. Bowel sounds are normal. There is no distension.     Palpations: Abdomen is soft.     Tenderness: There is no abdominal tenderness.  Musculoskeletal:        General: No swelling or deformity. Normal range of motion.     Cervical back: Normal range of motion.  Skin:    General: Skin is warm and dry.     Capillary Refill: Capillary refill takes less than 2 seconds.  Neurological:     Mental Status: He is alert and oriented to person, place, and time.     Motor: Weakness (Left hemiplegia) present.  Psychiatric:        Mood and Affect: Mood normal.        Behavior: Behavior normal.        Thought Content: Thought content normal.    Assessment & Plan:   Problem List Items Addressed This Visit       Partial epilepsy with impairment of consciousness (HCC) - Primary    History of focal epilepsy with impaired awareness.  He has recently started experiencing myoclonic jerking as well.  Followed by neurology at [facility] and  [facility].  He is prescribed Tegretol 3 times daily and Klonopin 1 mg as needed for myoclonic jerking.  Last episode of myoclonic jerking occurred 2-3 weeks ago according to his father.      Hyperlipidemia    Currently prescribed atorvastatin 40 mg daily.  Labs were recently updated by his previous PCP and lipid panel reflected adequate control. -Repeat  labs at follow-up in 1 year      Return in about 1 year (around 01/31/2024) for CPE.   Billie Lade, MD

## 2023-01-31 NOTE — Assessment & Plan Note (Signed)
History of focal epilepsy with impaired awareness.  He has recently started experiencing myoclonic jerking as well.  Followed by neurology at [facility] and [facility].  He is prescribed Tegretol 3 times daily and Klonopin 1 mg as needed for myoclonic jerking.  Last episode of myoclonic jerking occurred 2-3 weeks ago according to his father.

## 2023-03-22 NOTE — Progress Notes (Signed)
 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 His last imaging was in 2003. 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 He is taking and tolerating Tegretol  for his seizure disorder and has remained seizure free since March 1985. He has been experiencing intermittent myoclonic jerks since early 2021.   Today's concerns: He has remained seizure free since his last visit. He has intermittent episodes of myoclonic jerks that his family feels occurs most frequently in the afternoons or early evening. He was evaluated at [facility] and given Clonazepam  to take if needed but episodes have not occurred in clusters and therefore not needed. His family asked for a refill as the tablets he had were recalled by the manufacturer. Dennis Bautista has been otherwise generally healthy since he was last seen. No health concerns today other than previously mentioned.  Review of systems: Please see HPI for neurologic and other pertinent review of systems. Otherwise all other systems were reviewed and were negative.  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   Intellectual disability, mild 11/16/2016   Partial epilepsy with impairment of consciousness (HCC) 12/12/2012   Congenital reduction deformities of brain (HCC) 12/12/2012   Congenital hemiplegia (HCC) 12/12/2012   Abnormality of gait 12/12/2012     Past Medical History:  Diagnosis Date   Seizures (HCC)     Past medical history comments: See HPI  Surgical history: Past Surgical History:  Procedure Laterality Date   FINGER SURGERY     VENTRICULOPERITONEAL SHUNT  1986   has also had revisions of the shunt    Family history: family history includes Breast cancer in his paternal grandmother; Diabetes in an other family member; Emphysema in his maternal grandfather; Heart attack in his paternal grandfather; Heart disease in an other family member; Lung cancer in his paternal grandmother; Stomach cancer in his maternal grandmother.   Social history: Social History   Socioeconomic History   Marital status: Single    Spouse name: Not on file   Number of children: Not on file   Years of education: Not on file   Highest education level: Not on file  Occupational History   Not on file  Tobacco Use   Smoking status: Never   Smokeless tobacco: Never  Vaping Use   Vaping status: Never Used  Substance and Sexual Activity   Alcohol use: No   Drug use: No   Sexual activity: Never  Other Topics Concern   Not on file  Social History Narrative   [place] works with his father 5 days a week. 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  Lives with his parents.    Social Drivers of Corporate Investment Banker Strain: Not on file  Food Insecurity: Not on file  Transportation Needs: Not on file  Physical Activity: Not on file  Stress: Not on file  Social Connections: Not on file  Intimate Partner Violence: Not on file    Past/failed meds: Copied from previous record: Clonazepam  did not improve incidence  of myoclonic jerks   Allergies: Allergies  Allergen Reactions   Other     Seasonal Allergies    Immunizations: Immunization History  Administered Date(s) Administered   Janssen (J&J) SARS-COV-2 Vaccination 01/16/2020    Diagnostics/Screenings: Copied from previous record: 06/29/2020 - ambulatory EEG ([facility])- Based on the above clinical and EEG data: The patient's typical episodes were not recorded, but the background EEG suggests epiletogenecity in bitemporal region. CHARLENA PUFFER [facility], MD [facility]    Physical Exam: BP 110/72 (BP Location: Left Arm, Patient Position: Sitting, Cuff Size: Large)   Pulse 76   Ht 5' 8 (1.727 m)   Wt 164 lb 9.6 oz (74.7 kg)   BMI 25.03 kg/m   General: well developed, well nourished man, seated in exam room, in no evident distress Head: normocephalic and atraumatic. Oropharynx benign. No dysmorphic features. Neck: supple Cardiovascular: regular rate and rhythm, no murmurs. Respiratory: clear to auscultation bilaterally Abdomen: bowel sounds present all four quadrants, abdomen soft, non-tender, non-distended.  Musculoskeletal: has deformity of the right 5th finger, which is flexed into his palm. No obvious scoliosis.  Skin: no rashes or neurocutaneous lesions  Neurologic Exam Mental Status: awake and fully alert. Attention span normal for age. Fund of knowledge subnormal for age. Normal speech but needed help from his parents to answer questions. Cranial Nerves: fundoscopic exam - red reflex present.  Unable to fully visualize fundus.  Pupils equal briskly reactive to light.  Turns to localize faces and objects in the periphery. Turns to localize sounds in the periphery. Facial movements are symmetric. Motor: mild left hemiatrophy with left hemiplegia. Clumsy finger to thumb opposition Sensory: withdrawal x 4 Coordination: unable to adequately assess due to patient's inability to participate in examination. No dysmetria with  reach for objects. Gait and Station: mild left hemiparetic gait  Impression: Partial epilepsy with impairment of consciousness (HCC) - Plan: clonazePAM  (KLONOPIN ) 1 MG disintegrating tablet, TEGRETOL  200 MG tablet  Myoclonus - Plan: clonazePAM  (KLONOPIN ) 1 MG disintegrating tablet  Congenital reduction deformities of brain (HCC)  Congenital hemiplegia (HCC)  Abnormality of gait  Intellectual disability, mild   Recommendations for plan of care: The patient's previous Epic records were reviewed. No recent diagnostic studies to be reviewed with the patient.  Plan until next visit: Continue medications as prescribed  Clonazepam  refill sent Call if seizures occur or if myoclonus becomes more frequent or more severe.  Return in about 1 year (around 03/22/2024).  The medication list was reviewed and reconciled. No changes were made in the prescribed medications today. A complete medication list was provided to the patient.  Allergies as of 03/23/2023       Reactions   Other    Seasonal Allergies        Medication List        Accurate as of March 23, 2023  6:40 PM. If you have any questions, ask your nurse or doctor.          atorvastatin  40 MG tablet Commonly known as: LIPITOR Take 40 mg by mouth daily.   cetirizine 10  MG tablet Commonly known as: ZYRTEC Take 10 mg by mouth daily.   clonazePAM  1 MG disintegrating tablet Commonly known as: KLONOPIN  Take 1 tablet by mouth once per day for clusters of myoclonus What changed:  how much to take how to take this when to take this additional instructions Changed by: Ellouise Bollman   fluticasone 50 MCG/ACT nasal spray Commonly known as: FLONASE Place 2 sprays into both nostrils as needed.   TEGretol  200 MG tablet Generic drug: carbamazepine  TAKE ONE AND ONE-HALF TABLETS BY MOUTH IN THE MORNING, ONE TABLET AT 3 PM, AND ONE AND ONE-HALF TABLETS AT 8 PM.      Total time spent with the patient was 20 minutes, of  which 50% or more was spent in counseling and coordination of care.  Ellouise Bollman NP-C  Child Neurology and Pediatric Complex Care [address], [address] [place], [place] [address] Ph. [phone] Fax [phone]

## 2023-03-23 ENCOUNTER — Ambulatory Visit (INDEPENDENT_AMBULATORY_CARE_PROVIDER_SITE_OTHER): Payer: MEDICAID | Admitting: Family

## 2023-03-23 ENCOUNTER — Encounter (INDEPENDENT_AMBULATORY_CARE_PROVIDER_SITE_OTHER): Payer: Self-pay | Admitting: Family

## 2023-03-23 VITALS — BP 110/72 | HR 76 | Ht 68.0 in | Wt 164.6 lb

## 2023-03-23 DIAGNOSIS — F79 Unspecified intellectual disabilities: Secondary | ICD-10-CM

## 2023-03-23 DIAGNOSIS — Q043 Other reduction deformities of brain: Secondary | ICD-10-CM

## 2023-03-23 DIAGNOSIS — R269 Unspecified abnormalities of gait and mobility: Secondary | ICD-10-CM

## 2023-03-23 DIAGNOSIS — G808 Other cerebral palsy: Secondary | ICD-10-CM

## 2023-03-23 DIAGNOSIS — G40209 Localization-related (focal) (partial) symptomatic epilepsy and epileptic syndromes with complex partial seizures, not intractable, without status epilepticus: Secondary | ICD-10-CM | POA: Diagnosis not present

## 2023-03-23 DIAGNOSIS — F70 Mild intellectual disabilities: Secondary | ICD-10-CM

## 2023-03-23 DIAGNOSIS — G253 Myoclonus: Secondary | ICD-10-CM

## 2023-03-23 MED ORDER — TEGRETOL 200 MG PO TABS: ORAL_TABLET | ORAL | 5 refills | Status: AC

## 2023-03-23 MED ORDER — CLONAZEPAM 1 MG PO TBDP: ORAL_TABLET | ORAL | 0 refills | Status: DC

## 2023-03-23 NOTE — Patient Instructions (Signed)
 It was a pleasure to see you today!  Instructions for you until your next appointment are as follows: Continue your medications as prescribed Let me know if you have seizures or if the jerking spells become more frequent or problematic Please sign up for MyChart if you have not done so. Please plan to return for follow up in 1 year or sooner if needed.  Feel free to contact our office during normal business hours at [phone] with questions or concerns. If there is no answer or the call is outside business hours, please leave a message and our clinic staff will call you back within the next business day.  If you have an urgent concern, please stay on the line for our after-hours answering service and ask for the on-call neurologist.     I also encourage you to use MyChart to communicate with me more directly. If you have not yet signed up for MyChart within [facility], the front desk staff can help you. However, please note that this inbox is NOT monitored on nights or weekends, and response can take up to 2 business days.  Urgent matters should be discussed with the on-call pediatric neurologist.   At [facility], we are committed to providing exceptional care. You will receive a patient satisfaction survey through text or email regarding your visit today. Your opinion is important to me. Comments are appreciated.

## 2023-03-30 ENCOUNTER — Other Ambulatory Visit (INDEPENDENT_AMBULATORY_CARE_PROVIDER_SITE_OTHER): Payer: Self-pay | Admitting: Family

## 2023-03-30 DIAGNOSIS — G253 Myoclonus: Secondary | ICD-10-CM

## 2023-03-30 DIAGNOSIS — G40209 Localization-related (focal) (partial) symptomatic epilepsy and epileptic syndromes with complex partial seizures, not intractable, without status epilepticus: Secondary | ICD-10-CM

## 2023-03-30 NOTE — Telephone Encounter (Signed)
  Name of who is calling: [place] SR,REID P   Caller's Relationship to Patient: Father   Best contact number: [phone]  Provider they see: Inetta Fermo   Reason for call: patient's father called to request that the script for Klonopin be moved to [facility] on [address] in [place]. According to the pharmacy the current location does not have the medication but the recommended one does.      PRESCRIPTION REFILL ONLY  Name of prescription: Klonopin   Pharmacy: [facility] [address]

## 2023-03-31 MED ORDER — CLONAZEPAM 1 MG PO TBDP: ORAL_TABLET | ORAL | 0 refills | Status: DC

## 2023-04-06 ENCOUNTER — Other Ambulatory Visit (INDEPENDENT_AMBULATORY_CARE_PROVIDER_SITE_OTHER): Payer: Self-pay | Admitting: Family

## 2023-04-06 DIAGNOSIS — G253 Myoclonus: Secondary | ICD-10-CM

## 2023-04-06 DIAGNOSIS — G40209 Localization-related (focal) (partial) symptomatic epilepsy and epileptic syndromes with complex partial seizures, not intractable, without status epilepticus: Secondary | ICD-10-CM

## 2023-04-06 MED ORDER — CLONAZEPAM 1 MG PO TBDP: ORAL_TABLET | ORAL | 0 refills | Status: AC

## 2023-04-06 NOTE — Telephone Encounter (Signed)
Contacted patients dad. Verified patients name and DOB as well as dad's name.   Dad stated that the [facility] on [address] has the medication in stock.   SS, CCMA

## 2023-04-06 NOTE — Telephone Encounter (Signed)
Rx sent electronically. TG 

## 2023-04-06 NOTE — Telephone Encounter (Signed)
  Name of who is calling: Kalman Jewels Relationship to Patient: dad  Best contact number: [phone]  Provider they see: Inetta Fermo  Reason for call: Dad calling bc Inetta Fermo called in a prescription to a pharmacy and they dont have the medication requested, dad has found it at another pharmacy and he is trying to get it moved. Requesting a callback in reference to this.     PRESCRIPTION REFILL ONLY  Name of prescription:  Pharmacy:

## 2023-11-14 ENCOUNTER — Other Ambulatory Visit: Payer: Self-pay

## 2023-11-17 ENCOUNTER — Other Ambulatory Visit: Payer: Self-pay

## 2024-02-05 ENCOUNTER — Ambulatory Visit (INDEPENDENT_AMBULATORY_CARE_PROVIDER_SITE_OTHER)

## 2024-02-05 ENCOUNTER — Encounter: Payer: 59 | Admitting: Internal Medicine

## 2024-02-05 VITALS — BP 129/85 | HR 93 | Resp 16 | Ht 68.0 in | Wt 170.1 lb

## 2024-02-05 DIAGNOSIS — G40209 Localization-related (focal) (partial) symptomatic epilepsy and epileptic syndromes with complex partial seizures, not intractable, without status epilepticus: Secondary | ICD-10-CM | POA: Diagnosis not present

## 2024-02-05 DIAGNOSIS — E559 Vitamin D deficiency, unspecified: Secondary | ICD-10-CM

## 2024-02-05 DIAGNOSIS — Z13820 Encounter for screening for osteoporosis: Secondary | ICD-10-CM

## 2024-02-05 DIAGNOSIS — Z79899 Other long term (current) drug therapy: Secondary | ICD-10-CM

## 2024-02-05 DIAGNOSIS — Z0001 Encounter for general adult medical examination with abnormal findings: Secondary | ICD-10-CM | POA: Diagnosis not present

## 2024-02-05 DIAGNOSIS — E782 Mixed hyperlipidemia: Secondary | ICD-10-CM

## 2024-02-05 DIAGNOSIS — Z1159 Encounter for screening for other viral diseases: Secondary | ICD-10-CM

## 2024-02-05 DIAGNOSIS — Z0000 Encounter for general adult medical examination without abnormal findings: Secondary | ICD-10-CM

## 2024-02-05 MED ORDER — ATORVASTATIN CALCIUM 40 MG PO TABS: 40.0000 mg | ORAL_TABLET | Freq: Every day | ORAL | 3 refills | Status: AC

## 2024-02-05 NOTE — Progress Notes (Unsigned)
 Complete physical exam  Patient: Dennis Bautista   DOB: Jul 23, 1985   38 y.o. Male  MRN: [id number]  Subjective:    Chief Complaint  Patient presents with   Annual Exam    Cpe     Dennis Bautista is a 38 y.o. male who presents today for a complete physical exam. He reports consuming a {diet types:17450} diet. {types:19826} He generally feels {DESC; WELL/FAIRLY WELL/POORLY:18703}. He reports sleeping {DESC; WELL/FAIRLY WELL/POORLY:18703}. He {does/does not:200015} have additional problems to discuss today.    Most recent fall risk assessment:    02/05/2024    2:35 PM  Fall Risk   Falls in the past year? 0  Number falls in past yr: 0  Injury with Fall? 0  Follow up Falls evaluation completed     Most recent depression screenings:    03/26/2020    1:04 PM  PHQ 2/9 Scores  PHQ - 2 Score 0    {VISON DENTAL STD PSA (Optional):27386}  {History (Optional):23778}  Patient Care Team: Bevely Doffing, FNP as PCP - General (Family Medicine) [place], NP as Nurse Practitioner (Neurology)   Outpatient Medications Prior to Visit  Medication Sig   atorvastatin  (LIPITOR) 40 MG tablet TAKE ONE (1) TABLET BY MOUTH EVERY DAY FOR HIGH CHOLESTEROL   cetirizine (ZYRTEC) 10 MG tablet Take 10 mg by mouth daily.   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   TEGRETOL  200 MG tablet TAKE ONE AND ONE-HALF TABLETS BY MOUTH IN THE MORNING, ONE TABLET AT 3 PM, AND ONE AND ONE-HALF TABLETS AT 8 PM.   No facility-administered medications prior to visit.    ROS        Objective:     BP 129/85   Pulse 93   Resp 16   Ht 5' 8 (1.727 m)   Wt 170 lb 1.9 oz (77.2 kg)   SpO2 96%   BMI 25.87 kg/m  {Vitals History (Optional):23777}  Physical Exam Vitals and nursing note reviewed.  Constitutional:      Appearance: Normal  appearance.  HENT:     Head: Normocephalic.     Right Ear: Tympanic membrane, ear canal and external ear normal.     Left Ear: Tympanic membrane, ear canal and external ear normal.     Nose: Nose normal.     Mouth/Throat:     Mouth: Mucous membranes are moist.     Pharynx: Oropharynx is clear.  Eyes:     Extraocular Movements: Extraocular movements intact.     Conjunctiva/sclera: Conjunctivae normal.     Pupils: Pupils are equal, round, and reactive to light.  Cardiovascular:     Rate and Rhythm: Normal rate and regular rhythm.  Pulmonary:     Effort: Pulmonary effort is normal.     Breath sounds: Normal breath sounds.  Abdominal:     General: Bowel sounds are normal.     Palpations: Abdomen is soft.  Musculoskeletal:        General: Normal range of motion.     Cervical back: Normal range of motion and neck supple.  Skin:    General: Skin is warm and dry.  Neurological:     Mental Status: He is alert and oriented to person, place, and time.  Psychiatric:        Mood and Affect: Mood normal.  Thought Content: Thought content normal.      No results found for any visits on 02/05/24. {Show previous labs (optional):23779}    Assessment & Plan:    Routine Health Maintenance and Physical Exam  Immunization History  Administered Date(s) Administered   Influenza-Unspecified 11/30/2023   Janssen (J&J) SARS-COV-2 Vaccination 01/16/2020   Moderna Covid-19 Fall Seasonal Vaccine 65yrs & older 11/30/2023    Health Maintenance  Topic Date Due   Medicare Annual Wellness (AWV)  Never done   HIV Screening  Never done   Hepatitis C Screening  Never done   DTaP/Tdap/Td (1 - Tdap) Never done   Hepatitis B Vaccines 19-59 Average Risk (1 of 3 - 19+ 3-dose series) Never done   HPV VACCINES (1 - 3-dose SCDM series) Never done   COVID-19 Vaccine (3 - 2025-26 season) 05/29/2024   Influenza Vaccine  Completed   Pneumococcal Vaccine  Aged Out   Meningococcal B Vaccine  Aged Out     Discussed health benefits of physical activity, and encouraged him to engage in regular exercise appropriate for his age and condition.  Problem List Items Addressed This Visit   None  No follow-ups on file.     Leita Longs, FNP

## 2024-02-07 DIAGNOSIS — E559 Vitamin D deficiency, unspecified: Secondary | ICD-10-CM | POA: Insufficient documentation

## 2024-02-07 NOTE — Assessment & Plan Note (Signed)
 Managed with atorvastatin . Cholesterol levels lowest recorded. - Refilled atorvastatin  prescription.

## 2024-02-07 NOTE — Assessment & Plan Note (Signed)
 Long-term Tegretol  use for 30 years. Monitoring for side effects and bone health. - Ordered Tegretol  level. - Scheduled bone density scan.

## 2024-02-07 NOTE — Assessment & Plan Note (Signed)
 Routine visit with no acute concerns. Vital signs normal. - Ordered fasting labs including Tegretol  level, vitamin D , and B12. - Scheduled bone density scan due to long-term Tegretol  use.

## 2024-02-07 NOTE — Assessment & Plan Note (Signed)
 Ongoing supplementation. Monitoring levels for adequacy. - Ordered vitamin D  level.

## 2024-02-08 LAB — CBC WITH DIFFERENTIAL/PLATELET
Basophils Absolute: 0 x10E3/uL (ref 0.0–0.2)
Basos: 1 %
EOS (ABSOLUTE): 0.1 x10E3/uL (ref 0.0–0.4)
Eos: 2 %
Hematocrit: 44.1 % (ref 37.5–51.0)
Hemoglobin: 14.3 g/dL (ref 13.0–17.7)
Immature Grans (Abs): 0 x10E3/uL (ref 0.0–0.1)
Immature Granulocytes: 0 %
Lymphocytes Absolute: 2.1 x10E3/uL (ref 0.7–3.1)
Lymphs: 46 %
MCH: 31.7 pg (ref 26.6–33.0)
MCHC: 32.4 g/dL (ref 31.5–35.7)
MCV: 98 fL — ABNORMAL HIGH (ref 79–97)
Monocytes Absolute: 0.2 x10E3/uL (ref 0.1–0.9)
Monocytes: 5 %
Neutrophils Absolute: 2.1 x10E3/uL (ref 1.4–7.0)
Neutrophils: 46 %
Platelets: 302 x10E3/uL (ref 150–450)
RBC: 4.51 x10E6/uL (ref 4.14–5.80)
RDW: 12.6 % (ref 11.6–15.4)
WBC: 4.6 x10E3/uL (ref 3.4–10.8)

## 2024-02-08 LAB — CMP14+EGFR
ALT: 20 IU/L (ref 0–44)
AST: 16 IU/L (ref 0–40)
Albumin: 4.6 g/dL (ref 4.1–5.1)
Alkaline Phosphatase: 78 IU/L (ref 47–123)
BUN/Creatinine Ratio: 20 (ref 9–20)
BUN: 15 mg/dL (ref 6–20)
Bilirubin Total: <0.2 mg/dL (ref 0.0–1.2)
CO2: 24 mmol/L (ref 20–29)
Calcium: 9.6 mg/dL (ref 8.7–10.2)
Chloride: 105 mmol/L (ref 96–106)
Creatinine, Ser: 0.74 mg/dL — ABNORMAL LOW (ref 0.76–1.27)
Globulin, Total: 2.2 g/dL (ref 1.5–4.5)
Glucose: 98 mg/dL (ref 70–99)
Potassium: 4.3 mmol/L (ref 3.5–5.2)
Sodium: 143 mmol/L (ref 134–144)
Total Protein: 6.8 g/dL (ref 6.0–8.5)
eGFR: 118 mL/min/1.73 (ref >59)

## 2024-02-08 LAB — LIPID PANEL
Chol/HDL Ratio: 3.8 ratio (ref 0.0–5.0)
Cholesterol, Total: 139 mg/dL (ref 100–199)
HDL: 37 mg/dL — ABNORMAL LOW (ref >39)
LDL Chol Calc (NIH): 79 mg/dL (ref 0–99)
Triglycerides: 125 mg/dL (ref 0–149)
VLDL Cholesterol Cal: 23 mg/dL (ref 5–40)

## 2024-02-08 LAB — TSH+FREE T4
Free T4: 0.97 ng/dL (ref 0.82–1.77)
TSH: 1.25 u[IU]/mL (ref 0.450–4.500)

## 2024-02-08 LAB — HEPATITIS C ANTIBODY: Hep C Virus Ab: NONREACTIVE

## 2024-02-09 LAB — CARBAMAZEPINE LEVEL, TOTAL: Carbamazepine (Tegretol), S: 8.2 ug/mL (ref 4.0–12.0)

## 2024-02-09 LAB — B12 AND FOLATE PANEL
Folate: 5.9 ng/mL (ref >3.0)
Vitamin B-12: 604 pg/mL (ref 232–1245)

## 2024-02-09 LAB — VITAMIN D 25 HYDROXY (VIT D DEFICIENCY, FRACTURES): Vit D, 25-Hydroxy: 45.4 ng/mL (ref 30.0–100.0)

## 2024-02-13 ENCOUNTER — Ambulatory Visit (HOSPITAL_COMMUNITY)
Admission: RE | Admit: 2024-02-13 | Discharge: 2024-02-13 | Disposition: A | Discharge status: Home / Self Care | Source: Ambulatory Visit

## 2024-02-13 DIAGNOSIS — M859 Disorder of bone density and structure, unspecified: Secondary | ICD-10-CM | POA: Insufficient documentation

## 2024-02-13 DIAGNOSIS — Z79899 Other long term (current) drug therapy: Secondary | ICD-10-CM | POA: Insufficient documentation

## 2024-02-13 DIAGNOSIS — Z13820 Encounter for screening for osteoporosis: Secondary | ICD-10-CM | POA: Insufficient documentation

## 2024-02-21 ENCOUNTER — Telehealth: Payer: Self-pay

## 2024-02-21 ENCOUNTER — Ambulatory Visit: Payer: Self-pay

## 2024-02-21 NOTE — Telephone Encounter (Signed)
 Patient father came by asking about scan results, call father Robynn at [phone].

## 2024-02-21 NOTE — Telephone Encounter (Signed)
 Mychart message sent about scan results.

## 2024-02-22 NOTE — Telephone Encounter (Signed)
 Patient does not have mychart, father requesting call

## 2024-02-26 ENCOUNTER — Ambulatory Visit

## 2024-02-26 VITALS — Ht 68.0 in | Wt 170.0 lb

## 2024-02-26 DIAGNOSIS — Z0000 Encounter for general adult medical examination without abnormal findings: Secondary | ICD-10-CM

## 2024-02-26 NOTE — Progress Notes (Signed)
 Chief Complaint  Patient presents with   Medicare Wellness     Subjective:   Dennis Bautista is a 38 y.o. male who presents for a Medicare Annual Wellness Visit.  Visit info / Clinical Intake: Medicare Wellness Visit Type:: Initial Annual Wellness Visit Persons participating in visit and providing information:: patient & caregiver Medicare Wellness Visit Mode:: Telephone If telephone:: video error Since this visit was completed virtually, some vitals may be partially provided or unavailable. Missing vitals are due to the limitations of the virtual format.: Documented vitals are patient reported If Telephone or Video please confirm:: I connected with patient using audio/video enable telemedicine. I verified patient identity with two identifiers, discussed telehealth limitations, and patient agreed to proceed. Patient Location:: home Provider Location:: home office Interpreter Needed?: No Pre-visit prep was completed: yes AWV questionnaire completed by patient prior to visit?: no Living arrangements:: with family/others Patient's Overall Health Status Rating: good Typical amount of pain: none Does pain affect daily life?: no Are you currently prescribed opioids?: no  Dietary Habits and Nutritional Risks How many meals a day?: 3 Eats fruit and vegetables daily?: yes Most meals are obtained by: having others provide food In the last 2 weeks, have you had any of the following?: none Diabetic:: no  Functional Status Activities of Daily Living (to include ambulation/medication): Independent Ambulation: Independent Medication Administration: Needs assistance (comment) Is this a change from baseline?: Pre-admission baseline Home Management (perform basic housework or laundry): Needs assistance (comment) Manage your own finances?: (!) no Primary transportation is: family / friends Concerns about vision?: no *vision screening is required for WTM* Concerns about hearing?: no  Fall  Screening Falls in the past year?: 0 Number of falls in past year: 0 Was there an injury with Fall?: 0 Fall Risk Category Calculator: 0 Patient Fall Risk Level: Low Fall Risk  Fall Risk Patient at Risk for Falls Due to: Impaired mobility Fall risk Follow up: Falls evaluation completed; Education provided; Falls prevention discussed  Home and Transportation Safety: All rugs have non-skid backing?: yes All stairs or steps have railings?: yes Grab bars in the bathtub or shower?: yes Have non-skid surface in bathtub or shower?: yes Good home lighting?: yes Regular seat belt use?: yes Hospital stays in the last year:: no  Cognitive Assessment Difficulty concentrating, remembering, or making decisions? : yes Will 6CIT or Mini Cog be Completed: no 6CIT or Mini Cog Declined: patient has a diagnosis of dementia or cognitive impairment  Advance Directives (For Healthcare) Does Patient Have a Medical Advance Directive?: No Would patient like information on creating a medical advance directive?: Yes (MAU/Ambulatory/Procedural Areas - Information given)  Reviewed/Updated  Reviewed/Updated: Reviewed All (Medical, Surgical, Family, Medications, Allergies, Care Teams, Patient Goals)    Allergies (verified) Other   Current Medications (verified) Outpatient Encounter Medications as of 02/26/2024  Medication Sig   atorvastatin  (LIPITOR) 40 MG tablet Take 1 tablet (40 mg total) by mouth daily.   cetirizine (ZYRTEC) 10 MG tablet Take 10 mg by mouth daily.   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   TEGRETOL  200 MG tablet TAKE ONE AND ONE-HALF TABLETS BY MOUTH IN THE MORNING, ONE TABLET AT 3 PM, AND ONE AND ONE-HALF TABLETS AT 8 PM.   No facility-administered encounter medications on file as of 02/26/2024.    History: Past  Medical  History:  Diagnosis Date   Seizures (HCC)    Past Surgical History:  Procedure Laterality Date   FINGER SURGERY     VENTRICULOPERITONEAL SHUNT  1986   has also had revisions of the shunt   Family History  Problem Relation Age of Onset   Stomach cancer Maternal Grandmother        died of complications from appendicitis   Breast cancer Paternal Grandmother    Lung cancer Paternal Grandmother    Emphysema Maternal Grandfather    Heart attack Paternal Grandfather    Heart disease Other        Heart Disease in Grandparents   Diabetes Other        Diabetes in Grandparents   Social History   Occupational History   Not on file  Tobacco Use   Smoking status: Never   Smokeless tobacco: Never  Vaping Use   Vaping status: Never Used  Substance and Sexual Activity   Alcohol use: No   Drug use: No   Sexual activity: Never   Tobacco Counseling Counseling given: Not Answered  SDOH Screenings   Depression (PHQ2-9): Low Risk (02/26/2024)  Physical Activity: Insufficiently Active (02/26/2024)  Social Connections: Patient Unable To Answer (02/26/2024)  Stress: Patient Unable To Answer (02/26/2024)  Tobacco Use: Low Risk (02/26/2024)   See flowsheets for full screening details  Depression Screen PHQ 2 & 9 Depression Scale- Over the past 2 weeks, how often have you been bothered by any of the following problems? Little interest or pleasure in doing things: 0 Feeling down, depressed, or hopeless (PHQ Adolescent also includes...irritable): 0 PHQ-2 Total Score: 0     Goals Addressed             This Visit's Progress    Remain active   On track            Objective:    Today's Vitals   02/26/24 1358  Weight: 170 lb (77.1 kg)  Height: 5' 8 (1.727 m)   Body mass index is 25.85 kg/m.  Hearing/Vision screen Hearing Screening - Comments:: Patient is able to hear conversational tones without difficulty. No issues reported.   Vision Screening - Comments::  No vision problems; will schedule routine eye exam Immunizations and Health Maintenance Health Maintenance  Topic Date Due   HIV Screening  Never done   DTaP/Tdap/Td (1 - Tdap) Never done   Hepatitis B Vaccines 19-59 Average Risk (1 of 3 - 19+ 3-dose series) Never done   HPV VACCINES (1 - 3-dose SCDM series) Never done   COVID-19 Vaccine (3 - 2025-26 season) 05/29/2024   Medicare Annual Wellness (AWV)  02/25/2025   Influenza Vaccine  Completed   Hepatitis C Screening  Completed   Pneumococcal Vaccine  Aged Out   Meningococcal B Vaccine  Aged Out        Assessment/Plan:  This is a routine wellness examination for Dennis Bautista.  Patient Care Team: Bevely Doffing, FNP as PCP - General (Family Medicine) [place], NP as Nurse Practitioner (Neurology) Margette Donnice ORN, MD as Referring Physician (Neurology)  I have personally reviewed and noted the following in the patients chart:   Medical and social history Use of alcohol, tobacco or illicit drugs  Current medications and supplements including opioid prescriptions. Functional ability and status Nutritional status Physical activity Advanced directives List of other physicians Hospitalizations, surgeries, and ER visits in previous 12 months Vitals Screenings to include cognitive, depression, and falls Referrals and appointments  No orders of  the defined types were placed in this encounter.  In addition, I have reviewed and discussed with patient certain preventive protocols, quality metrics, and best practice recommendations. A written personalized care plan for preventive services as well as general preventive health recommendations were provided to patient.   Lavelle Charmaine Browner, LPN   [id number]   Return in 1 year (on 02/25/2025).  After Visit Summary: (Declined) Due to this being a telephonic visit, with patients personalized plan was offered to patient but patient Declined AVS at this time   Nurse Notes: No  voiced or noted concerns at this time

## 2024-02-26 NOTE — Patient Instructions (Signed)
 Dennis Bautista,  Thank you for taking the time for your Medicare Wellness Visit. I appreciate your continued commitment to your health goals. Please review the care plan we discussed, and feel free to reach out if I can assist you further.  Please note that Annual Wellness Visits do not include a physical exam. Some assessments may be limited, especially if the visit was conducted virtually. If needed, we may recommend an in-person follow-up with your provider.  Ongoing Care Seeing your primary care provider every 3 to 6 months helps us  monitor your health and provide consistent, personalized care.   Referrals If a referral was made during today's visit and you haven't received any updates within two weeks, please contact the referred provider directly to check on the status.  Recommended Screenings:  Health Maintenance  Topic Date Due   HIV Screening  Never done   DTaP/Tdap/Td vaccine (1 - Tdap) Never done   Hepatitis B Vaccine (1 of 3 - 19+ 3-dose series) Never done   HPV Vaccine (1 - 3-dose SCDM series) Never done   COVID-19 Vaccine (3 - 2025-26 season) 05/29/2024   Medicare Annual Wellness Visit  02/25/2025   Flu Shot  Completed   Hepatitis C Screening  Completed   Pneumococcal Vaccine  Aged Out   Meningitis B Vaccine  Aged Out       02/26/2024    2:00 PM  Advanced Directives  Does Patient Have a Medical Advance Directive? No  Would patient like information on creating a medical advance directive? Yes (MAU/Ambulatory/Procedural Areas - Information given)   Information on Advanced Care Planning can be found at [place]  Secretary of [facility] Advance Health Care Directives Advance Health Care Directives ([email or web address]   Vision: Annual vision screenings are recommended for early detection of glaucoma, cataracts, and diabetic retinopathy. These exams can also reveal signs of chronic conditions such as diabetes and high blood pressure.  Dental: Annual dental screenings help detect  early signs of oral cancer, gum disease, and other conditions linked to overall health, including heart disease and diabetes.  Please see the attached documents for additional preventive care recommendations.

## 2024-03-28 ENCOUNTER — Ambulatory Visit (INDEPENDENT_AMBULATORY_CARE_PROVIDER_SITE_OTHER): Payer: 59 | Admitting: Family

## 2024-04-22 ENCOUNTER — Ambulatory Visit (INDEPENDENT_AMBULATORY_CARE_PROVIDER_SITE_OTHER): Admitting: Family

## 2025-02-05 ENCOUNTER — Encounter
